# Patient Record
Sex: Female | Born: 1990 | Race: Black or African American | Hispanic: No | Marital: Single | Smoking: Never smoker
Health system: Southern US, Community
[De-identification: ages and names within clinical notes are randomized; demographics above are authoritative.]

## PROBLEM LIST (undated history)

## (undated) DIAGNOSIS — G709 Myoneural disorder, unspecified: Secondary | ICD-10-CM

## (undated) DIAGNOSIS — F32A Depression, unspecified: Secondary | ICD-10-CM

## (undated) DIAGNOSIS — F419 Anxiety disorder, unspecified: Secondary | ICD-10-CM

## (undated) DIAGNOSIS — E282 Polycystic ovarian syndrome: Secondary | ICD-10-CM

## (undated) DIAGNOSIS — T7840XA Allergy, unspecified, initial encounter: Secondary | ICD-10-CM

## (undated) DIAGNOSIS — R519 Headache, unspecified: Secondary | ICD-10-CM

## (undated) HISTORY — DX: Polycystic ovarian syndrome: E28.2

## (undated) HISTORY — DX: Headache, unspecified: R51.9

## (undated) HISTORY — DX: Myoneural disorder, unspecified: G70.9

## (undated) HISTORY — DX: Allergy, unspecified, initial encounter: T78.40XA

## (undated) HISTORY — DX: Anxiety disorder, unspecified: F41.9

## (undated) HISTORY — DX: Depression, unspecified: F32.A

---

## 2020-05-16 ENCOUNTER — Encounter: Payer: Self-pay | Admitting: Obstetrics and Gynecology

## 2020-05-16 ENCOUNTER — Ambulatory Visit (INDEPENDENT_AMBULATORY_CARE_PROVIDER_SITE_OTHER): Payer: BC Managed Care – PPO | Admitting: Obstetrics and Gynecology

## 2020-05-16 ENCOUNTER — Other Ambulatory Visit (HOSPITAL_COMMUNITY)
Admission: RE | Admit: 2020-05-16 | Discharge: 2020-05-16 | Disposition: A | Discharge status: Home / Self Care | Payer: BC Managed Care – PPO | Source: Ambulatory Visit | Attending: Obstetrics and Gynecology | Admitting: Obstetrics and Gynecology

## 2020-05-16 ENCOUNTER — Other Ambulatory Visit: Payer: Self-pay

## 2020-05-16 VITALS — BP 115/79 | HR 89 | Ht 64.0 in | Wt 352.0 lb

## 2020-05-16 DIAGNOSIS — Z01419 Encounter for gynecological examination (general) (routine) without abnormal findings: Secondary | ICD-10-CM

## 2020-05-16 DIAGNOSIS — G43109 Migraine with aura, not intractable, without status migrainosus: Secondary | ICD-10-CM | POA: Insufficient documentation

## 2020-05-16 DIAGNOSIS — Z6841 Body Mass Index (BMI) 40.0 and over, adult: Secondary | ICD-10-CM | POA: Insufficient documentation

## 2020-05-16 DIAGNOSIS — N921 Excessive and frequent menstruation with irregular cycle: Secondary | ICD-10-CM | POA: Insufficient documentation

## 2020-05-16 DIAGNOSIS — N946 Dysmenorrhea, unspecified: Secondary | ICD-10-CM

## 2020-05-16 NOTE — Progress Notes (Signed)
Obstetrics and Gynecology New Patient Evaluation  Appointment Date: 05/16/2020  OBGYN Clinic: [facility]  Primary Care Provider: None  Referring Provider: Self  Chief Complaint:  Chief Complaint  Patient presents with  . Gynecologic Exam  Establish GYN care, annual exam  History of Present Illness: Michelle Velasquez is a 29 y.o. African-American G0P0000 (Patient's last menstrual period was 04/23/2020.), seen for the above chief complaint. Her past medical history is significant for BMI 60s, PCOS, h/o migraines with aura   Patient got insurance and is establishing care. Issues include heavy and irregular periods (see below). No desire for pregnancy any time soon.   Review of Systems: A comprehensive review of systems was negative.    Patient Active Problem List   Diagnosis Date Noted  . Migraine with aura and without status migrainosus, not intractable 05/16/2020  . BMI 60.0-69.9, adult (HCC) 05/16/2020    Past Medical History:  Past Medical History:  Diagnosis Date  . PCOS (polycystic ovarian syndrome)     Past Surgical History:  History reviewed. No pertinent surgical history.  Past Obstetrical History:  OB History  Gravida Para Term Preterm AB Living  0 0 0 0 0 0  SAB TAB Ectopic Multiple Live Births  0 0 0 0 0    Past Gynecological History: As per HPI. Menarche age 60 Periods: early July, 10 days, heavy and painful. She can go 1-3 months w/o a period and they are always heavy and painful and last for 10 days. No h/o any medications, interventions to help with periods History of Pap Smear(s): Yes.   Last pap unknown. No h/o abnormal paps History of STI(s): No. She is currently using no method for contraception.   Social History:  Social History   Socioeconomic History  . Marital status: Single    Spouse name: Not on file  . Number of children: Not on file  . Years of education: Not on file  . Highest education level:  Not on file  Occupational History  . Not on file  Tobacco Use  . Smoking status: Never Smoker  . Smokeless tobacco: Never Used  Substance and Sexual Activity  . Alcohol use: Never  . Drug use: Never  . Sexual activity: Yes  Other Topics Concern  . Not on file  Social History Narrative  . Not on file   Social Determinants of Health   Financial Resource Strain:   . Difficulty of Paying Living Expenses:   Food Insecurity:   . Worried About Programme researcher, broadcasting/film/video in the Last Year:   . Barista in the Last Year:   Transportation Needs:   . Freight forwarder (Medical):   Marland Kitchen Lack of Transportation (Non-Medical):   Physical Activity:   . Days of Exercise per Week:   . Minutes of Exercise per Session:   Stress:   . Feeling of Stress :   Social Connections:   . Frequency of Communication with Friends and Family:   . Frequency of Social Gatherings with Friends and Family:   . Attends Religious Services:   . Active Member of Clubs or Organizations:   . Attends Banker Meetings:   Marland Kitchen Marital Status:   Intimate Partner Violence:   . Fear of Current or Ex-Partner:   . Emotionally Abused:   Marland Kitchen Physically Abused:   . Sexually Abused:     Family History:  Family History  Problem Relation Age of Onset  . Diabetes  Father   . Multiple sclerosis Mother       Medications None  Allergies Sulfa antibiotics and Grass pollen(k-o-r-t-swt vern)   Physical Exam:  BP 115/79   Pulse 89   Ht 5\' 4"  (1.626 m)   Wt (!) 352 lb (159.7 kg)   LMP 04/23/2020   BMI 60.42 kg/m  Body mass index is 60.42 kg/m. General appearance: Well nourished, well developed female in no acute distress.  Neck:  Supple, normal appearance, and no thyromegaly  Cardiovascular: normal s1 and s2.  No murmurs, rubs or gallops. Respiratory:  Clear to auscultation bilateral. Normal respiratory effort Abdomen: positive bowel sounds and no masses, hernias; diffusely non tender to palpation, non  distended Breasts: breasts appear normal, no suspicious masses, no skin or nipple changes or axillary nodes, and normal palpation. Neuro/Psych:  Normal mood and affect.  Skin:  Warm and dry.  Lymphatic:  No inguinal lymphadenopathy.   Pelvic exam: is limited by body habitus EGBUS: within normal limits Vagina: within normal limits and with no blood or discharge in the vault Cervix: normal appearing cervix without tenderness, discharge or lesions. Uterus:  nonenlarged and non tender Adnexa:  normal adnexa and no mass, fullness, tenderness Rectovaginal: deferred  Regular sized Graves speculum able to easily visualize cervix.   Laboratory: None  Radiology: None  Assessment: pt stable  Plan:  1. Primary care Patient fasting. Will refer to primary care and get basic labs today, with primary concern to establish care and help with weight loss.   2. Heavy, irregular periods D/w her re: weight loss will help with period regularity. Given her h/o migraines with aura, I told her I don't recommend any estrogen options. Options d/w her were POPs, cyclic provera, depo provera, mirena IUD and I told her I'd recommend mirena IUD given less chance for systemic side effects. Brochure given and will have her follow up in a few weeks  3. GYN care  RTC 2-3 weeks for follow up period discussion  06/24/2020 MD Attending [facility])

## 2020-05-17 ENCOUNTER — Encounter: Payer: Self-pay | Admitting: Obstetrics and Gynecology

## 2020-05-17 DIAGNOSIS — R7303 Prediabetes: Secondary | ICD-10-CM | POA: Insufficient documentation

## 2020-05-17 LAB — CYTOLOGY - PAP
Chlamydia: NEGATIVE
Comment: NEGATIVE
Comment: NEGATIVE
Comment: NEGATIVE
Comment: NORMAL
Diagnosis: NEGATIVE
High risk HPV: NEGATIVE
Neisseria Gonorrhea: NEGATIVE
Trichomonas: NEGATIVE

## 2020-05-17 LAB — URINALYSIS, ROUTINE W REFLEX MICROSCOPIC
Bilirubin, UA: NEGATIVE
Glucose, UA: NEGATIVE
Ketones, UA: NEGATIVE
Leukocytes,UA: NEGATIVE
Nitrite, UA: NEGATIVE
Protein,UA: NEGATIVE
RBC, UA: NEGATIVE
Specific Gravity, UA: 1.023 (ref 1.005–1.030)
Urobilinogen, Ur: 0.2 mg/dL (ref 0.2–1.0)
pH, UA: 6.5 (ref 5.0–7.5)

## 2020-05-17 LAB — COMPREHENSIVE METABOLIC PANEL
ALT: 21 IU/L (ref 0–32)
AST: 19 IU/L (ref 0–40)
Albumin/Globulin Ratio: 1.2 (ref 1.2–2.2)
Albumin: 3.9 g/dL (ref 3.8–4.8)
Alkaline Phosphatase: 93 IU/L (ref 48–121)
BUN/Creatinine Ratio: 11 (ref 9–23)
BUN: 9 mg/dL (ref 6–20)
Bilirubin Total: 0.4 mg/dL (ref 0.0–1.2)
CO2: 20 mmol/L (ref 20–29)
Calcium: 9.3 mg/dL (ref 8.7–10.2)
Chloride: 103 mmol/L (ref 96–106)
Creatinine, Ser: 0.82 mg/dL (ref 0.57–1.00)
GFR calc Af Amer: 110 mL/min/{1.73_m2} (ref >59)
GFR calc non Af Amer: 96 mL/min/{1.73_m2} (ref >59)
Globulin, Total: 3.2 g/dL (ref 1.5–4.5)
Glucose: 105 mg/dL — ABNORMAL HIGH (ref 65–99)
Potassium: 4.4 mmol/L (ref 3.5–5.2)
Sodium: 138 mmol/L (ref 134–144)
Total Protein: 7.1 g/dL (ref 6.0–8.5)

## 2020-05-17 LAB — CBC
Hematocrit: 35 % (ref 34.0–46.6)
Hemoglobin: 10.9 g/dL — ABNORMAL LOW (ref 11.1–15.9)
MCH: 22.7 pg — ABNORMAL LOW (ref 26.6–33.0)
MCHC: 31.1 g/dL — ABNORMAL LOW (ref 31.5–35.7)
MCV: 73 fL — ABNORMAL LOW (ref 79–97)
Platelets: 303 10*3/uL (ref 150–450)
RBC: 4.8 x10E6/uL (ref 3.77–5.28)
RDW: 19.1 % — ABNORMAL HIGH (ref 11.7–15.4)
WBC: 6.6 10*3/uL (ref 3.4–10.8)

## 2020-05-17 LAB — HEPATITIS C ANTIBODY: Hep C Virus Ab: <0.1 s/co ratio (ref 0.0–0.9)

## 2020-05-17 LAB — HEPATITIS B SURFACE ANTIGEN: Hepatitis B Surface Ag: NEGATIVE

## 2020-05-17 LAB — HEMOGLOBIN A1C
Est. average glucose Bld gHb Est-mCnc: 117 mg/dL
Hgb A1c MFr Bld: 5.7 % — ABNORMAL HIGH (ref 4.8–5.6)

## 2020-05-17 LAB — LIPID PANEL
Chol/HDL Ratio: 3.9 ratio (ref 0.0–4.4)
Cholesterol, Total: 152 mg/dL (ref 100–199)
HDL: 39 mg/dL — ABNORMAL LOW (ref >39)
LDL Chol Calc (NIH): 96 mg/dL (ref 0–99)
Triglycerides: 89 mg/dL (ref 0–149)
VLDL Cholesterol Cal: 17 mg/dL (ref 5–40)

## 2020-05-17 LAB — RPR: RPR Ser Ql: NONREACTIVE

## 2020-05-17 LAB — TSH: TSH: 3.1 u[IU]/mL (ref 0.450–4.500)

## 2020-05-17 LAB — HIV ANTIBODY (ROUTINE TESTING W REFLEX): HIV Screen 4th Generation wRfx: NONREACTIVE

## 2020-05-17 LAB — PROLACTIN: Prolactin: 12.4 ng/mL (ref 4.8–23.3)

## 2020-05-30 ENCOUNTER — Ambulatory Visit: Payer: BC Managed Care – PPO | Admitting: Family Medicine

## 2020-06-25 ENCOUNTER — Ambulatory Visit: Payer: BC Managed Care – PPO | Admitting: Medical

## 2020-06-27 ENCOUNTER — Ambulatory Visit: Payer: BC Managed Care – PPO | Admitting: Family Medicine

## 2020-08-07 DIAGNOSIS — A059 Bacterial foodborne intoxication, unspecified: Secondary | ICD-10-CM | POA: Diagnosis not present

## 2020-08-07 DIAGNOSIS — R112 Nausea with vomiting, unspecified: Secondary | ICD-10-CM | POA: Diagnosis not present

## 2020-08-19 ENCOUNTER — Ambulatory Visit: Payer: BC Managed Care – PPO | Admitting: Medical

## 2020-08-19 DIAGNOSIS — Z0289 Encounter for other administrative examinations: Secondary | ICD-10-CM

## 2020-10-04 ENCOUNTER — Emergency Department (HOSPITAL_COMMUNITY)
Admission: EM | Admit: 2020-10-04 | Discharge: 2020-10-05 | Disposition: A | Discharge status: Home / Self Care | Payer: BC Managed Care – PPO | Attending: Emergency Medicine | Admitting: Emergency Medicine

## 2020-10-04 ENCOUNTER — Encounter (HOSPITAL_COMMUNITY): Payer: Self-pay | Admitting: Emergency Medicine

## 2020-10-04 ENCOUNTER — Other Ambulatory Visit: Payer: Self-pay

## 2020-10-04 DIAGNOSIS — G43909 Migraine, unspecified, not intractable, without status migrainosus: Secondary | ICD-10-CM | POA: Diagnosis not present

## 2020-10-04 DIAGNOSIS — H53132 Sudden visual loss, left eye: Secondary | ICD-10-CM | POA: Diagnosis not present

## 2020-10-04 DIAGNOSIS — R7303 Prediabetes: Secondary | ICD-10-CM | POA: Diagnosis not present

## 2020-10-04 NOTE — ED Triage Notes (Signed)
Pt presents to ED POV. Pt c/o migraines and loss o f vision in L eye. Pt states that she has hx of migraine. PERRLA, AAO x4

## 2020-10-05 ENCOUNTER — Emergency Department (HOSPITAL_BASED_OUTPATIENT_CLINIC_OR_DEPARTMENT_OTHER): Payer: BC Managed Care – PPO

## 2020-10-05 ENCOUNTER — Encounter (HOSPITAL_BASED_OUTPATIENT_CLINIC_OR_DEPARTMENT_OTHER): Payer: Self-pay | Admitting: Emergency Medicine

## 2020-10-05 ENCOUNTER — Emergency Department (HOSPITAL_BASED_OUTPATIENT_CLINIC_OR_DEPARTMENT_OTHER)
Admission: EM | Admit: 2020-10-05 | Discharge: 2020-10-05 | Disposition: A | Discharge status: Home / Self Care | Payer: BC Managed Care – PPO | Source: Home / Self Care | Attending: Emergency Medicine | Admitting: Emergency Medicine

## 2020-10-05 DIAGNOSIS — G43109 Migraine with aura, not intractable, without status migrainosus: Secondary | ICD-10-CM

## 2020-10-05 DIAGNOSIS — G4459 Other complicated headache syndrome: Secondary | ICD-10-CM | POA: Insufficient documentation

## 2020-10-05 DIAGNOSIS — R7303 Prediabetes: Secondary | ICD-10-CM | POA: Insufficient documentation

## 2020-10-05 LAB — HCG, QUANTITATIVE, PREGNANCY: hCG, Beta Chain, Quant, S: <1 m[IU]/mL (ref <5)

## 2020-10-05 IMAGING — CT CT HEAD W/O CM
3 series · 15 of 45 positions shown, 18 images · non-contrast
Comparison: None.

CLINICAL DATA: Migraine headaches with left-sided visual loss. No
reported injury.

EXAM:
CT HEAD WITHOUT CONTRAST
TECHNIQUE: Contiguous axial images were obtained from the base of the skull
through the vertex without intravenous contrast.

[Series 2: head wo · axial · 0.39mm/px · z∈[+729,+844]mm · 9 of 28 slices shown, 12 images]
[im 3/28  brain]
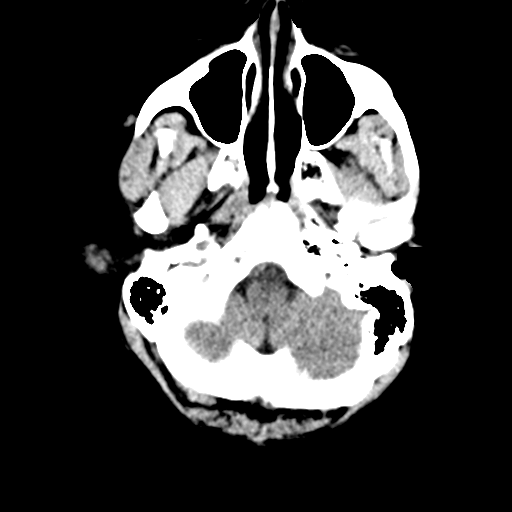
[im 3/28  bone]
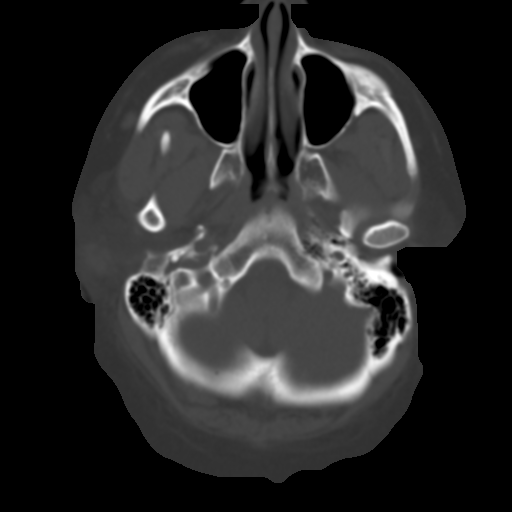
[im 6/28  brain]
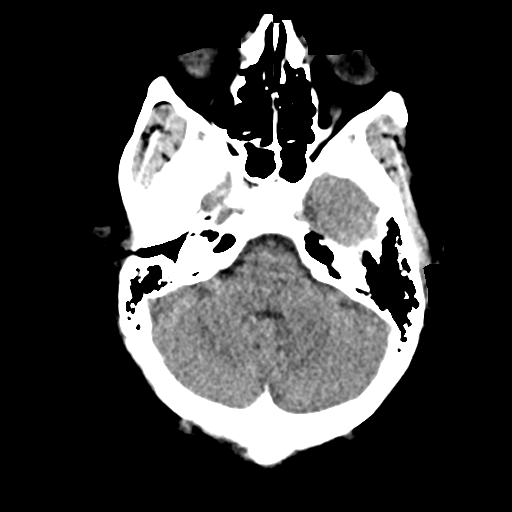
[im 9/28  brain]
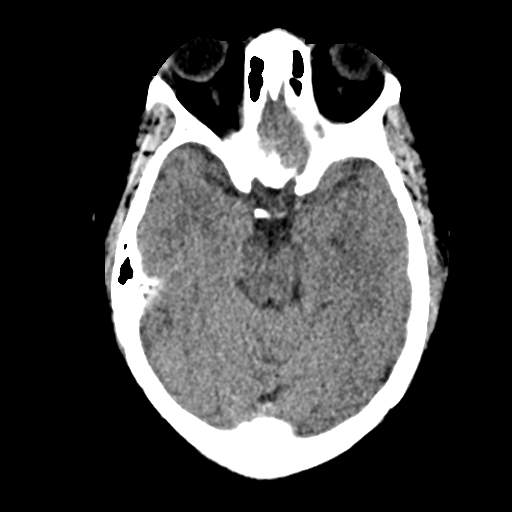
[im 12/28  brain]
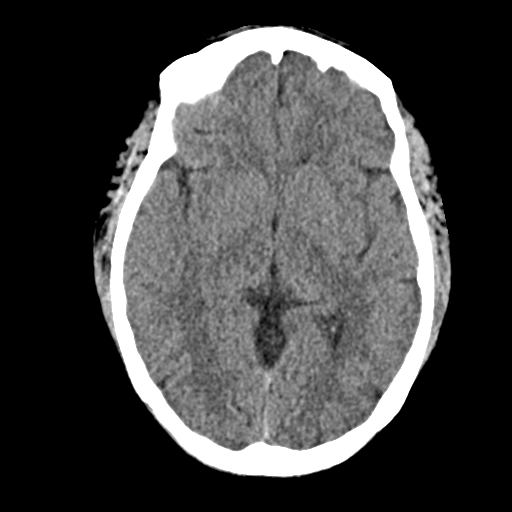
[im 15/28  brain]
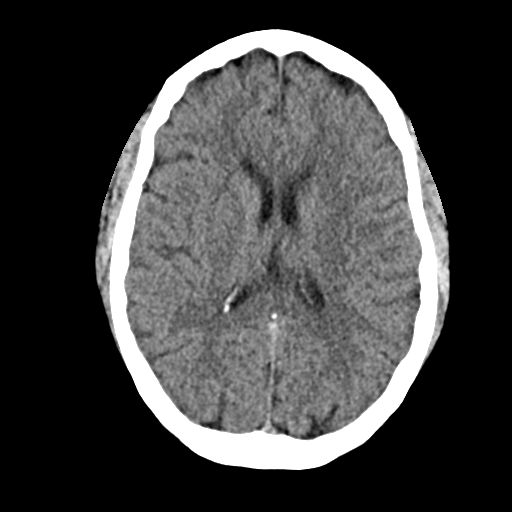
[im 15/28  bone]
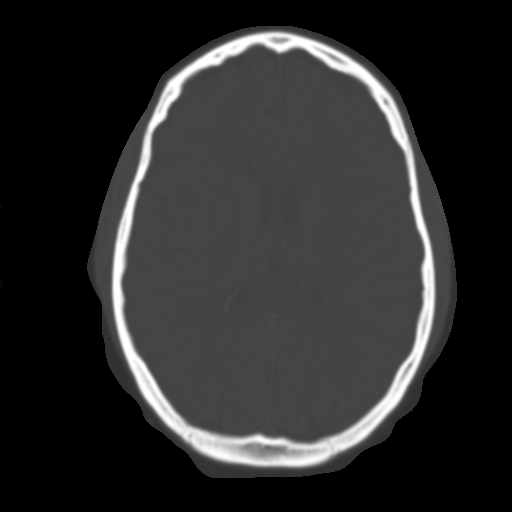
[im 17/28  brain]
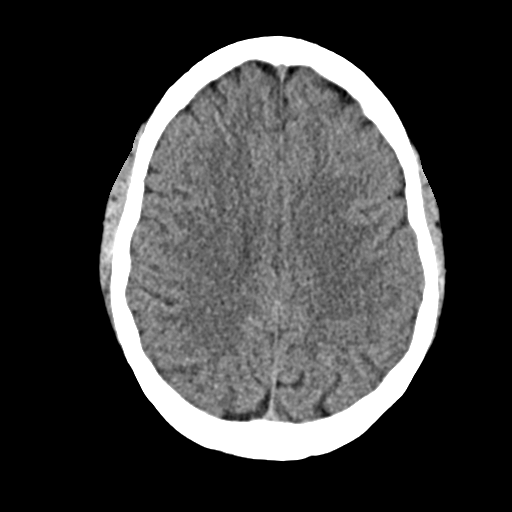
[im 20/28  brain]
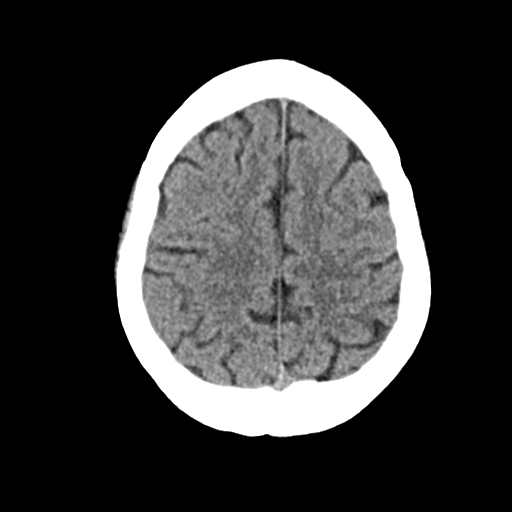
[im 23/28  brain]
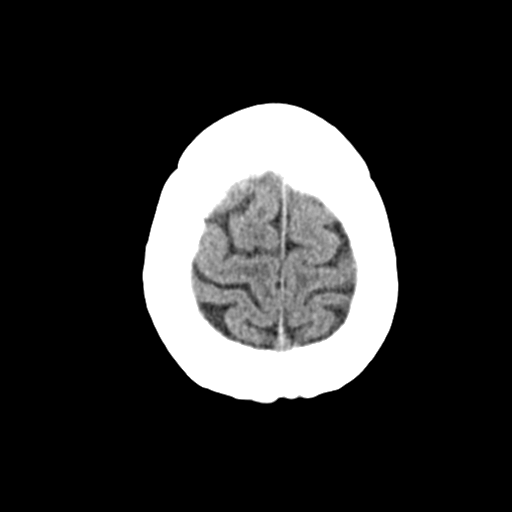
[im 26/28  brain]
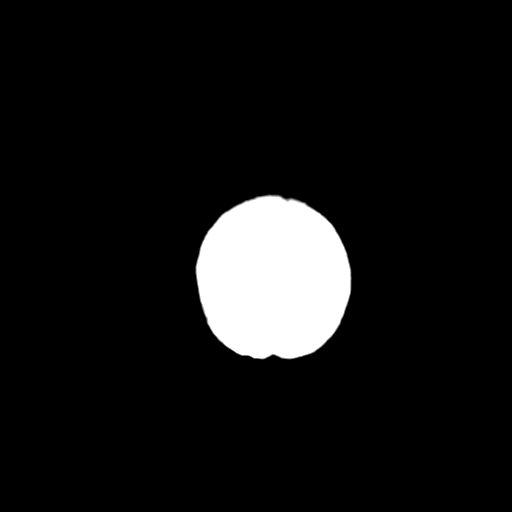
[im 26/28  bone]
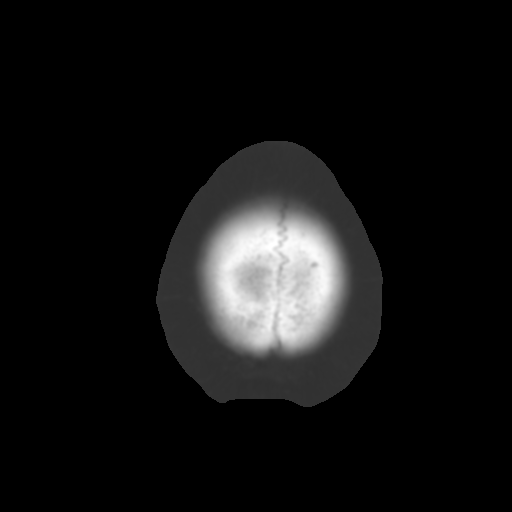

[Series 4: coronal soft · coronal · 0.29mm/px · 3 of 64 slices shown]
[im 22/64  brain]
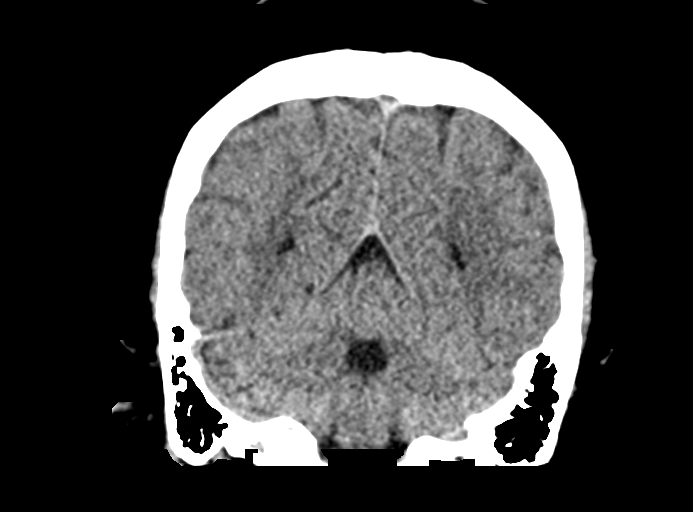
[im 29/64  brain]
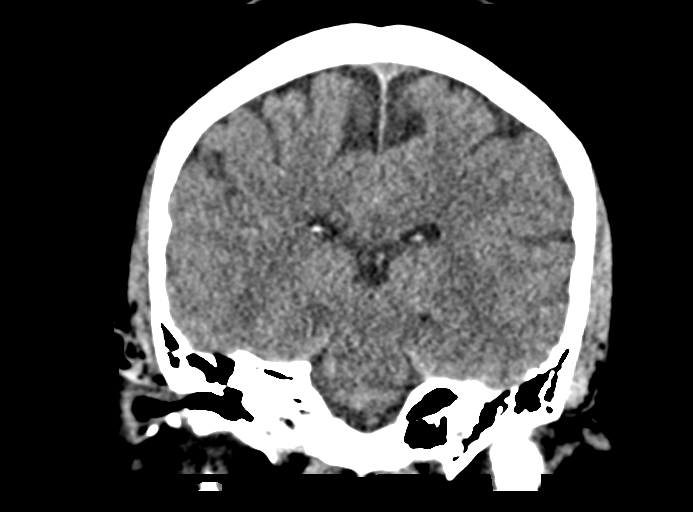
[im 36/64  brain]
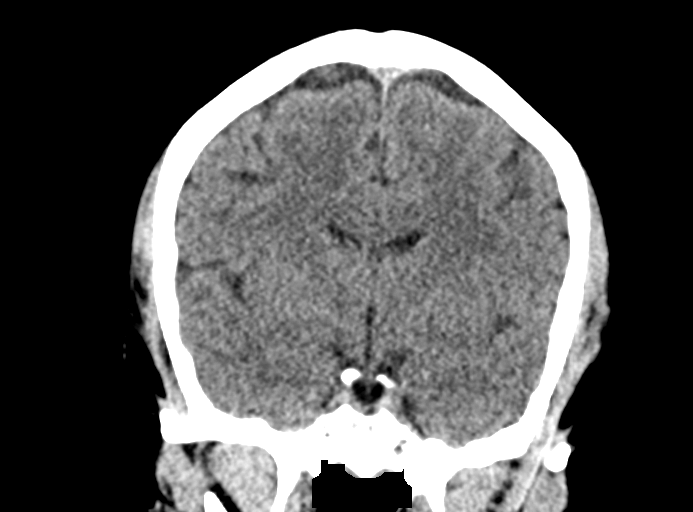

[Series 5: sag soft · sagittal · 0.28mm/px · 3 of 53 slices shown]
[im 18/53  brain]
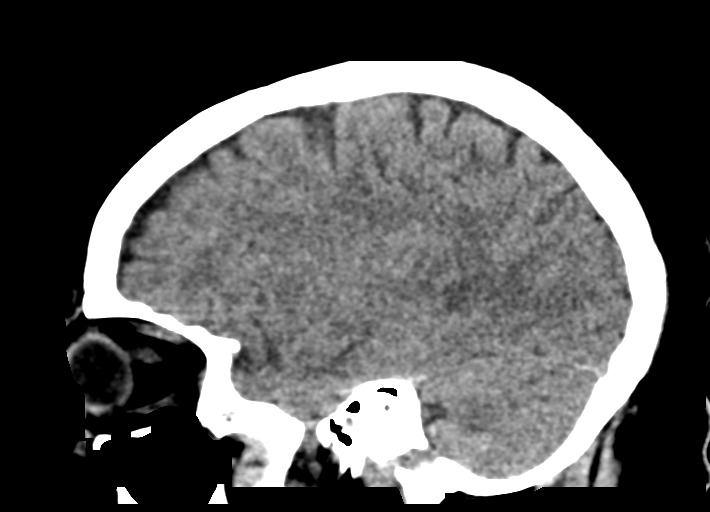
[im 27/53  brain]
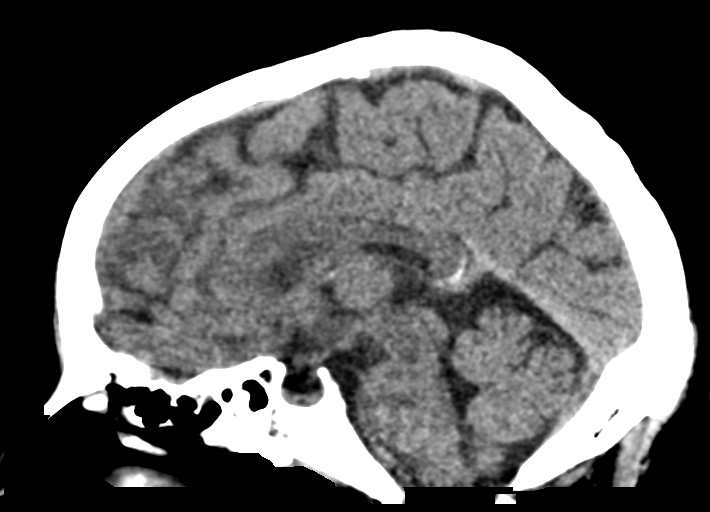
[im 35/53  brain]
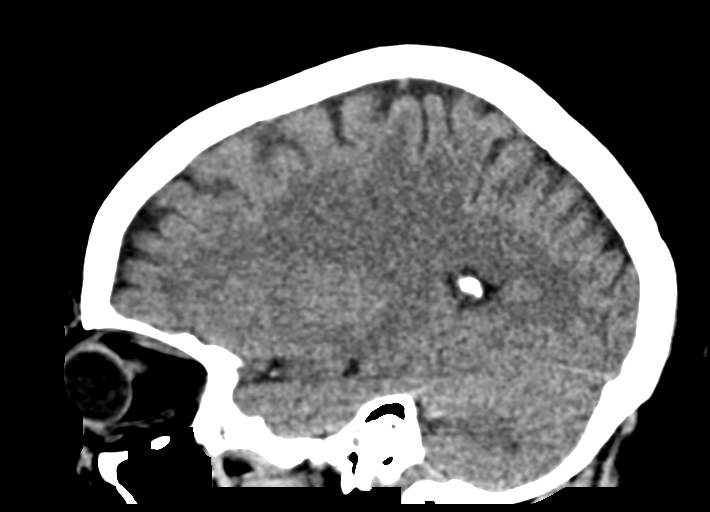

[15 of 45 positions shown; findings below may reference images not displayed]

FINDINGS: Brain: No evidence of parenchymal hemorrhage or extra-axial fluid
collection. No mass lesion, mass effect, or midline shift. No CT
evidence of acute infarction. Cerebral volume is age appropriate. No
ventriculomegaly.

Vascular: No acute abnormality.

Skull: No evidence of calvarial fracture.

Sinuses/Orbits: The visualized paranasal sinuses are essentially
clear.

Other:  The mastoid air cells are unopacified.
IMPRESSION: Negative head CT. No evidence of acute intracranial abnormality.

## 2020-10-05 MED ORDER — DIPHENHYDRAMINE HCL 50 MG/ML IJ SOLN
25.0000 mg | Freq: Once | INTRAMUSCULAR | Status: AC
  Administered 2020-10-05: 25 mg via INTRAVENOUS
  Filled 2020-10-05: qty 1

## 2020-10-05 MED ORDER — PROCHLORPERAZINE EDISYLATE 10 MG/2ML IJ SOLN
10.0000 mg | Freq: Once | INTRAMUSCULAR | Status: AC
  Administered 2020-10-05: 10 mg via INTRAVENOUS
  Filled 2020-10-05: qty 2

## 2020-10-05 MED ORDER — DEXAMETHASONE SODIUM PHOSPHATE 10 MG/ML IJ SOLN
10.0000 mg | Freq: Once | INTRAMUSCULAR | Status: AC
  Administered 2020-10-05: 10 mg via INTRAVENOUS
  Filled 2020-10-05: qty 1

## 2020-10-05 NOTE — ED Triage Notes (Addendum)
Pt reports that she had a migraine yesterday and complete loss of vision to L eye that lasted 20 min. She went to [facility] but LWBS. States the vision has returned but still endorses headache.

## 2020-10-05 NOTE — ED Notes (Addendum)
Patient ambulated to CT

## 2020-10-05 NOTE — ED Notes (Signed)
Pt called for vitals no answer. °

## 2020-10-05 NOTE — ED Notes (Addendum)
Unsuccessful IV attempt x 2, RAC, LAC. Pt tolerated well. Sophie, RN will attempt IV

## 2020-10-05 NOTE — ED Notes (Signed)
ED Provider at bedside. 

## 2020-10-05 NOTE — ED Notes (Signed)
Pt ambulatory with steady gait to restroom 

## 2020-10-07 NOTE — ED Provider Notes (Signed)
[facility] EMERGENCY DEPARTMENT Provider Note   CSN: [id number] Arrival date & time: 10/05/20  0841     History Chief Complaint  Patient presents with  . Eye Problem  . Headache    Michelle Velasquez is a 29 y.o. female.  29 year old female with history of migraines who presents to the emergency department today secondary to increasing number of migraines.  Patient states that she has had a call 10 times for work in the last month alone.  Patient states that her last 24 hours she noticed that she did have some decreased vision in her left eye.  Initially she stated that it was black but then it came back and was just blurry and now it still blurry but better.  She states her mom has a history of multiple sclerosis but she does not have a personal history of it.  She is also overweight but her headache seems to be right behind the eye and not necessarily diffuse.  She has no problems with the right eye.  She has no neurologic complaints otherwise.  Does not take any thing for headaches at home besides over-the-counter medications.  Has never seen a neurologist.   Eye Problem Associated symptoms: headaches   Headache      Past Medical History:  Diagnosis Date  . PCOS (polycystic ovarian syndrome)     Patient Active Problem List   Diagnosis Date Noted  . Prediabetes 05/17/2020  . Migraine with aura and without status migrainosus, not intractable 05/16/2020  . BMI 60.0-69.9, adult (HCC) 05/16/2020  . Dysmenorrhea 05/16/2020  . Menorrhagia with irregular cycle 05/16/2020    History reviewed. No pertinent surgical history.   OB History    Gravida  0   Para  0   Term  0   Preterm  0   AB  0   Living  0     SAB  0   IAB  0   Ectopic  0   Multiple  0   Live Births  0           Family History  Problem Relation Age of Onset  . Diabetes Father   . Multiple sclerosis Mother     Social History   Tobacco Use  . Smoking status: Never Smoker   . Smokeless tobacco: Never Used  Substance Use Topics  . Alcohol use: Never  . Drug use: Never    Home Medications Prior to Admission medications   Not on File    Allergies    Sulfa antibiotics and Grass pollen(k-o-r-t-swt vern)  Review of Systems   Review of Systems  Neurological: Positive for headaches.  All other systems reviewed and are negative.   Physical Exam Updated Vital Signs BP (!) 135/92 (BP Location: Right Wrist)   Pulse 90   Temp 99 F (37.2 C) (Oral)   Resp 18   Ht 5\' 4"  (1.626 m)   Wt (!) 158.8 kg   SpO2 99%   BMI 60.08 kg/m   Physical Exam Vitals and nursing note reviewed.  Constitutional:      Appearance: She is well-developed and well-nourished.  HENT:     Head: Normocephalic and atraumatic.     Nose: Nose normal. No congestion or rhinorrhea.     Mouth/Throat:     Mouth: Mucous membranes are moist.     Pharynx: Oropharynx is clear.  Eyes:     General: No visual field deficit.    Pupils: Pupils are equal, round,  and reactive to light.  Cardiovascular:     Rate and Rhythm: Normal rate and regular rhythm.  Pulmonary:     Effort: No respiratory distress.     Breath sounds: No stridor.  Abdominal:     General: Abdomen is flat. There is no distension.  Musculoskeletal:        General: No swelling or tenderness. Normal range of motion.     Cervical back: Normal range of motion.  Skin:    General: Skin is warm and dry.  Neurological:     Mental Status: She is alert and oriented to person, place, and time.     GCS: GCS eye subscore is 4. GCS verbal subscore is 5. GCS motor subscore is 6.     Cranial Nerves: No cranial nerve deficit, dysarthria or facial asymmetry.     Sensory: No sensory deficit.     Motor: No weakness.     Coordination: Romberg sign negative. Coordination normal.     Gait: Gait normal.     Deep Tendon Reflexes: Reflexes normal.     ED Results / Procedures / Treatments   Labs (all labs ordered are listed, but only  abnormal results are displayed) Labs Reviewed  HCG, QUANTITATIVE, PREGNANCY    EKG None  Radiology CT Head Wo Contrast  Result Date: 10/05/2020 CLINICAL DATA:  Migraine headaches with left-sided visual loss. No reported injury. EXAM: CT HEAD WITHOUT CONTRAST TECHNIQUE: Contiguous axial images were obtained from the base of the skull through the vertex without intravenous contrast. COMPARISON:  None. FINDINGS: Brain: No evidence of parenchymal hemorrhage or extra-axial fluid collection. No mass lesion, mass effect, or midline shift. No CT evidence of acute infarction. Cerebral volume is age appropriate. No ventriculomegaly. Vascular: No acute abnormality. Skull: No evidence of calvarial fracture. Sinuses/Orbits: The visualized paranasal sinuses are essentially clear. Other:  The mastoid air cells are unopacified. IMPRESSION: Negative head CT. No evidence of acute intracranial abnormality. Electronically Signed   By: Delbert Phenix M.D.   On: 10/05/2020 10:29    Procedures Procedures (including critical care time)  Medications Ordered in ED Medications  prochlorperazine (COMPAZINE) injection 10 mg (10 mg Intravenous Given 10/05/20 1046)  diphenhydrAMINE (BENADRYL) injection 25 mg (25 mg Intravenous Given 10/05/20 1041)  dexamethasone (DECADRON) injection 10 mg (10 mg Intravenous Given 10/05/20 1042)    ED Course  I have reviewed the triage vital signs and the nursing notes.  Pertinent labs & imaging results that were available during my care of the patient were reviewed by me and considered in my medical decision making (see chart for details).    MDM Rules/Calculators/A&P                          Considered idiopathic intracranial hypertension however does not really fit that as the headache is one-sided and started with black vision and improved slowly rather than the other way around.  Also consider multiple sclerosis however is not a typical story for that. Doubt stroke or head  bleed with presentation and normal ct. Symptoms improved significantly with headache cocktail so it could very well be a complicatedmigraine. Either way, needs neuro follow up. Referral placed. Return precautions discussed.   Final Clinical Impression(s) / ED Diagnoses Final diagnoses:  Complicated migraine    Rx / DC Orders ED Discharge Orders         Ordered    Ambulatory referral to Neurology       Comments:  An appointment is requested in approximately: 2 weeks   10/05/20 1331           Jamaya Sleeth, Barbara Cower, MD 10/07/20 [phone]
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
## 2020-10-24 ENCOUNTER — Ambulatory Visit (INDEPENDENT_AMBULATORY_CARE_PROVIDER_SITE_OTHER): Payer: Self-pay | Admitting: Neurology

## 2020-10-24 ENCOUNTER — Other Ambulatory Visit: Payer: Self-pay

## 2020-10-24 ENCOUNTER — Encounter: Payer: Self-pay | Admitting: Neurology

## 2020-10-24 VITALS — BP 136/87 | HR 112 | Ht 64.0 in | Wt 356.0 lb

## 2020-10-24 DIAGNOSIS — R410 Disorientation, unspecified: Secondary | ICD-10-CM

## 2020-10-24 DIAGNOSIS — G43109 Migraine with aura, not intractable, without status migrainosus: Secondary | ICD-10-CM

## 2020-10-24 DIAGNOSIS — G4719 Other hypersomnia: Secondary | ICD-10-CM

## 2020-10-24 DIAGNOSIS — H5462 Unqualified visual loss, left eye, normal vision right eye: Secondary | ICD-10-CM

## 2020-10-24 MED ORDER — RIZATRIPTAN BENZOATE 10 MG PO TBDP: ORAL_TABLET | ORAL | 5 refills | Status: DC

## 2020-10-24 MED ORDER — TOPIRAMATE 50 MG PO TABS: 50.0000 mg | ORAL_TABLET | Freq: Every day | ORAL | 5 refills | Status: DC

## 2020-10-24 MED ORDER — ONDANSETRON HCL 4 MG PO TABS: 4.0000 mg | ORAL_TABLET | Freq: Three times a day (TID) | ORAL | 5 refills | Status: DC | PRN

## 2020-10-24 NOTE — Patient Instructions (Signed)
  1. MRI of brain with and without contrast 2. Routine EEG 3. Sleep study 4. Refer to ophthalmology 5. Start topiramate 50mg  at bedtime.  Contact in 4 weeks with update and we can increase dose if needed. 6. Take rizatriptan 10mg  at earliest onset of headache.  May repeat dose once in 2 hours if needed.  Maximum 2 tablets in 24 hours.  Continue ondansetron (Zofran) for nausea as needed. 7. Limit use of pain relievers to no more than 2 days out of the week.  These medications include acetaminophen, NSAIDs (ibuprofen/Advil/Motrin, naproxen/Aleve, triptans (Imitrex/sumatriptan), Excedrin, and narcotics.  This will help reduce risk of rebound headaches. 8. Be aware of common food triggers:  - Caffeine:  coffee, black tea, cola, Mt. Dew  - Chocolate  - Dairy:  aged cheeses (brie, blue, cheddar, gouda, [place], provolone, [place], Swiss, etc), chocolate milk, buttermilk, sour cream, limit eggs and yogurt  - Nuts, peanut butter  - Alcohol  - Cereals/grains:  FRESH breads (fresh bagels, sourdough, doughnuts), yeast productions  - Processed/canned/aged/cured meats (pre-packaged deli meats, hotdogs)  - MSG/glutamate:  soy sauce, flavor enhancer, pickled/preserved/marinated foods  - Sweeteners:  aspartame (Equal, Nutrasweet).  Sugar and Splenda are okay  - Vegetables:  legumes (lima beans, lentils, snow peas, fava beans, pinto peans, peas, garbanzo beans), sauerkraut, onions, olives, pickles  - Fruit:  avocados, bananas, citrus fruit (orange, lemon, grapefruit), mango  - Other:  Frozen meals, macaroni and cheese 9. Routine exercise 10. Stay adequately hydrated (aim for 64 oz water daily) 11. Keep headache diary 12. Maintain proper stress management 13. Maintain proper sleep hygiene 14. Do not skip meals 15. Consider supplements:  magnesium citrate 400mg  daily, riboflavin 400mg  daily, coenzyme Q10 100mg  three times daily.

## 2020-11-04 ENCOUNTER — Other Ambulatory Visit: Payer: BC Managed Care – PPO

## 2020-11-06 ENCOUNTER — Ambulatory Visit (INDEPENDENT_AMBULATORY_CARE_PROVIDER_SITE_OTHER): Payer: No Typology Code available for payment source | Admitting: Neurology

## 2020-11-06 ENCOUNTER — Other Ambulatory Visit: Payer: Self-pay

## 2020-11-06 DIAGNOSIS — R410 Disorientation, unspecified: Secondary | ICD-10-CM | POA: Diagnosis not present

## 2020-11-11 NOTE — Procedures (Signed)
ELECTROENCEPHALOGRAM REPORT  Date of Study: 11/06/2020  Patient's Name: Michelle Velasquez MRN: [id number] Date of Birth: [birth year]   Clinical History: 30 year old female with episodic confusion.  History of "night terrors" in which she wakes up scared and confused.   Medications: None  Technical Summary: A multichannel digital EEG recording measured by the international 10-20 system with electrodes applied with paste and impedances below 5000 ohms performed in our laboratory with EKG monitoring in an awake and asleep patient.  Hyperventilation not performed as patient wearing a face mask due to the COVID-19 pandemic.  Photic stimulation was performed.  The digital EEG was referentially recorded, reformatted, and digitally filtered in a variety of bipolar and referential montages for optimal display.    Description: The patient is awake and asleep during the recording.  During maximal wakefulness, there is a symmetric, medium voltage 10 Hz posterior dominant rhythm that attenuates with eye opening.  The record is symmetric.  During drowsiness and sleep, there is an increase in theta slowing of the background.  Vertex waves and symmetric sleep spindles were seen.  Photic stimulation did not elicit any abnormalities.  There were no epileptiform discharges or electrographic seizures seen.    EKG lead was unremarkable.  Impression: This awake and asleep EEG is normal.    Clinical Correlation: A normal EEG does not exclude a clinical diagnosis of epilepsy.  If further clinical questions remain, prolonged EEG may be helpful.  Clinical correlation is advised.   Shon Millet, DO

## 2020-11-12 NOTE — Progress Notes (Signed)
Tried calling pt, no answer. LMOVM to call the office in regards to EEG results.

## 2020-11-13 NOTE — Progress Notes (Signed)
Called patient and left a message to call back office.

## 2020-11-23 ENCOUNTER — Other Ambulatory Visit: Payer: Self-pay

## 2020-11-25 ENCOUNTER — Ambulatory Visit
Admission: RE | Admit: 2020-11-25 | Discharge: 2020-11-25 | Disposition: A | Discharge status: Home / Self Care | Payer: No Typology Code available for payment source | Source: Ambulatory Visit | Attending: Neurology | Admitting: Neurology

## 2020-11-25 ENCOUNTER — Other Ambulatory Visit: Payer: Self-pay

## 2020-11-25 ENCOUNTER — Telehealth: Payer: Self-pay | Admitting: Neurology

## 2020-11-25 DIAGNOSIS — R410 Disorientation, unspecified: Secondary | ICD-10-CM

## 2020-11-25 DIAGNOSIS — G43109 Migraine with aura, not intractable, without status migrainosus: Secondary | ICD-10-CM

## 2020-11-25 DIAGNOSIS — H5462 Unqualified visual loss, left eye, normal vision right eye: Secondary | ICD-10-CM

## 2020-11-25 IMAGING — MR MR HEAD WO/W CM
13 series · 48 of 48 positions shown · IV contrast (multihance)
Comparison: Head CT [DATE]

CLINICAL DATA: Headache, chronic. Family history of multiple
sclerosis.

EXAM:
MRI HEAD WITHOUT AND WITH CONTRAST
TECHNIQUE: Multiplanar, multiecho pulse sequences of the brain and surrounding
structures were obtained without and with intravenous contrast.
CONTRAST:  20mL MULTIHANCE GADOBENATE DIMEGLUMINE 529 MG/ML IV SOLN

[Series 2: t1_se_sag · sagittal · 5.0mm · 0.45mm/px · 1 of 21 slices shown]
[im 1/21]
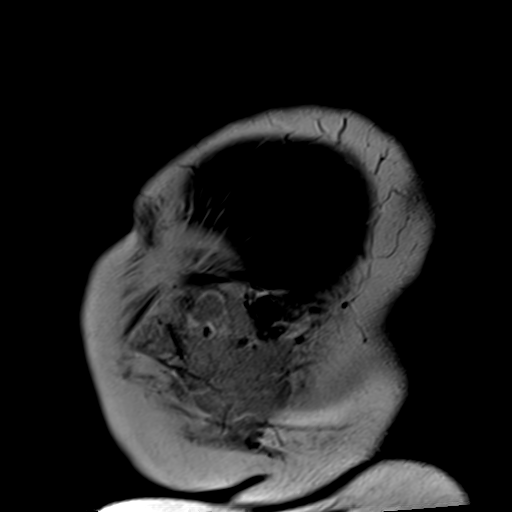

[Series 3: ep2d_diff_3 · axial · 3.0mm · 1.80mm/px · z∈[-46,+94]mm · 5 of 91 slices shown]
[im 1/91]
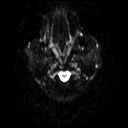
[im 23/91]
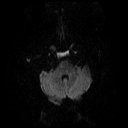
[im 46/91]
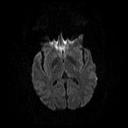
[im 68/91]
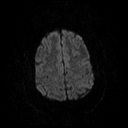
[im 91/91]
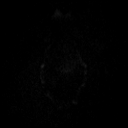

[Series 4: ep2d_diff_3_adc · axial · 3.0mm · 1.80mm/px · z∈[-46,+94]mm · 2 of 48 slices shown]
[im 1/48]
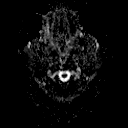
[im 48/48]
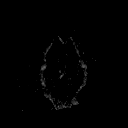

[Series 5: ep2d_diff_cor · coronal · 5.0mm · 1.77mm/px · 3 of 47 slices shown]
[im 1/47]
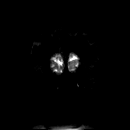
[im 24/47]
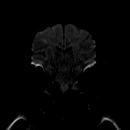
[im 47/47]
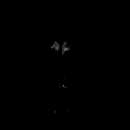

[Series 6: ep2d_diff_cor_adc · coronal · 5.0mm · 1.77mm/px · 2 of 24 slices shown]
[im 1/24]
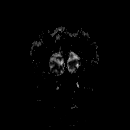
[im 24/24]
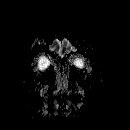

[Series 8: swi_images · axial · 2.0mm · 0.90mm/px · z∈[-55,+103]mm · 5 of 80 slices shown]
[im 1/80]
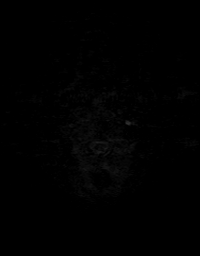
[im 20/80]
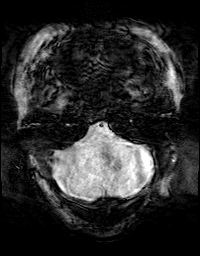
[im 40/80]
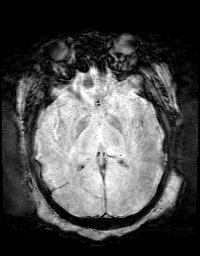
[im 60/80]
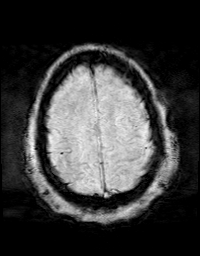
[im 80/80]
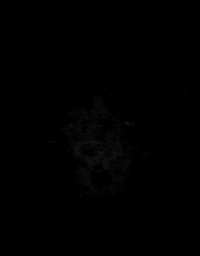

[Series 9: FLAIR · axial · 3.0mm · 0.43mm/px · z∈[-54,+102]mm · 2 of 27 slices shown (1 of 2)]
[im 1/27]
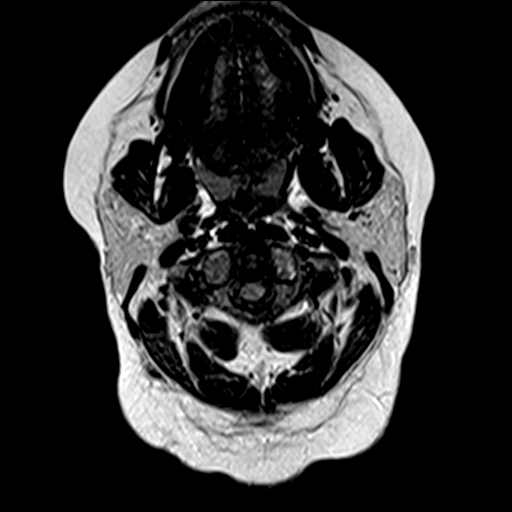
[im 27/27]
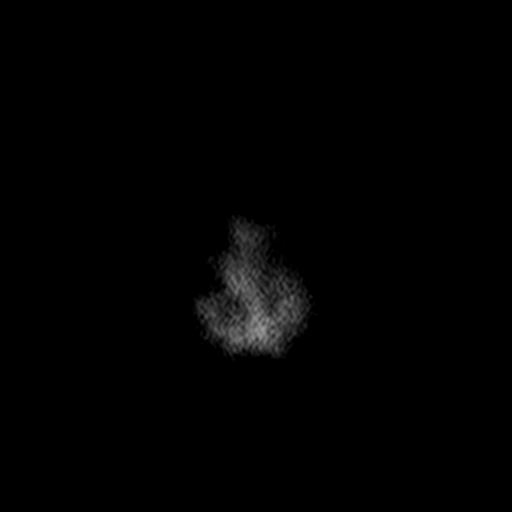

[Series 10: t2_tse_tra_512 · axial · 5.0mm · 0.60mm/px · z∈[-47,+91]mm · 2 of 24 slices shown]
[im 1/24]
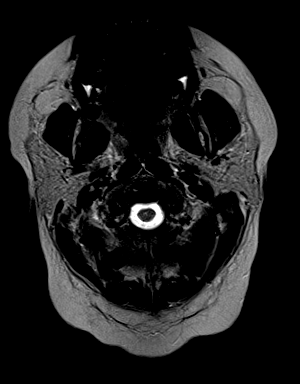
[im 24/24]
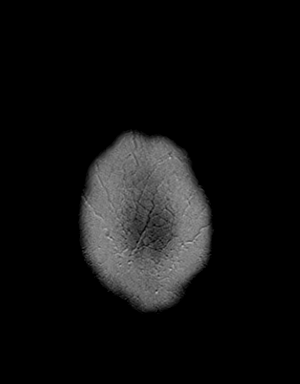

[Series 11: FLAIR · sagittal · 5.0mm · 0.45mm/px · 2 of 25 slices shown (2 of 2)]
[im 1/25]
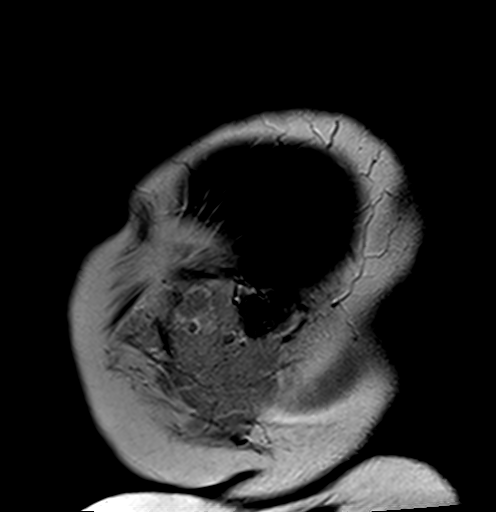
[im 25/25]
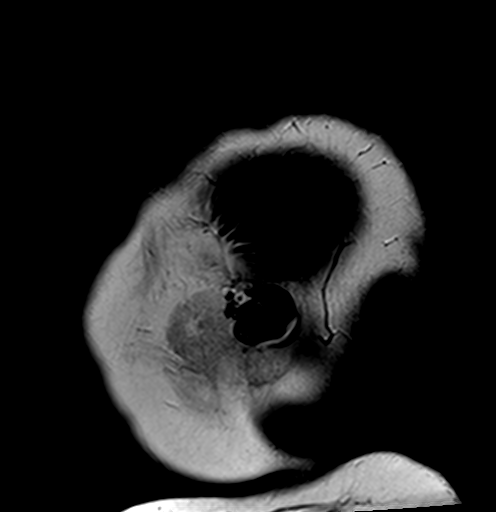

[Series 12: t1_mpr_tra · axial · 1.0mm · 0.72mm/px · z∈[-57,+102]mm · 10 of 160 slices shown]
[im 1/160]
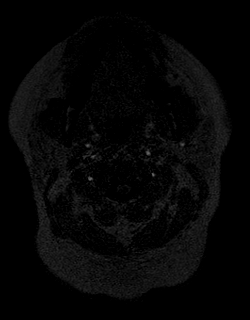
[im 18/160]
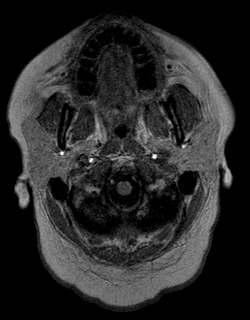
[im 36/160]
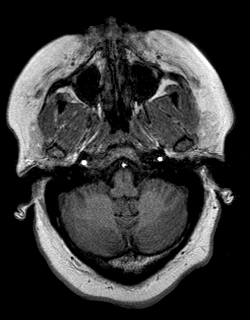
[im 54/160]
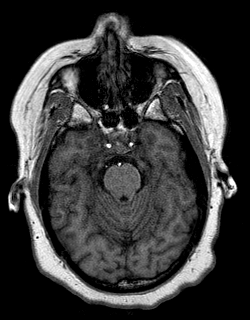
[im 71/160]
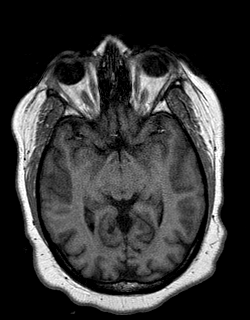
[im 89/160]
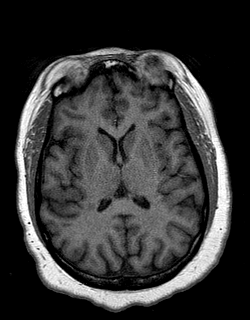
[im 107/160]
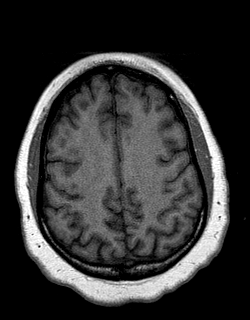
[im 124/160]
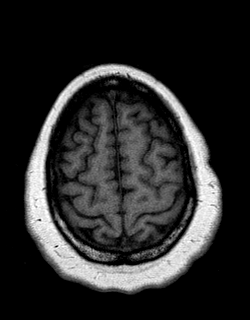
[im 142/160]
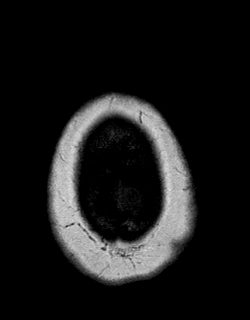
[im 160/160]
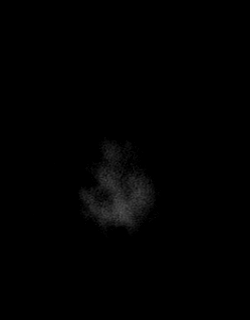

[Series 14: T2 · coronal · 5.0mm · 0.45mm/px · 2 of 26 slices shown]
[im 1/26]
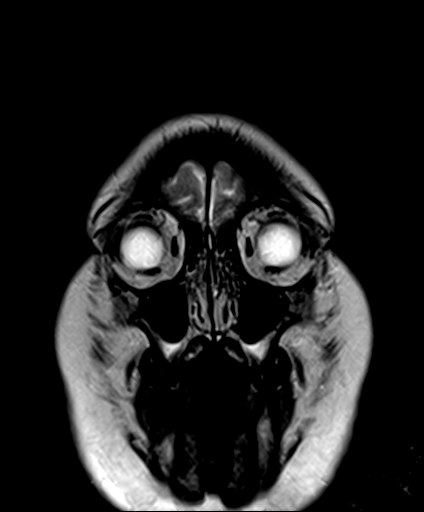
[im 26/26]
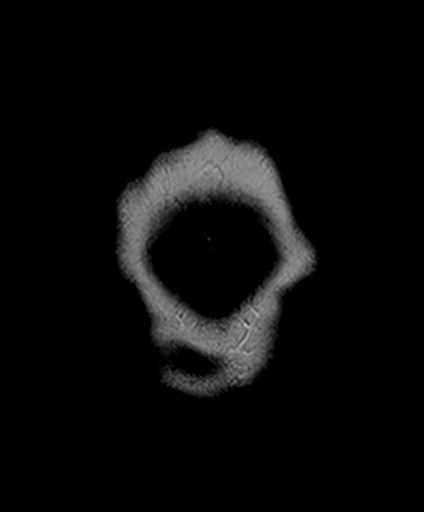

[Series 15: T1 post-contrast · coronal · 5.0mm · 0.72mm/px · 2 of 26 slices shown]
[im 1/26]
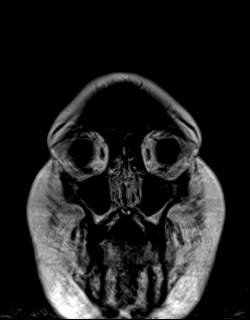
[im 26/26]
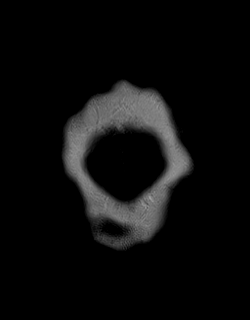

[Series 16: post t1_mpr_tra · axial · 1.0mm · 0.72mm/px · z∈[-61,+98]mm · 10 of 160 slices shown]
[im 1/160]
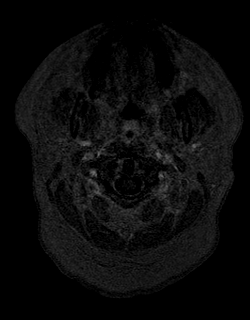
[im 18/160]
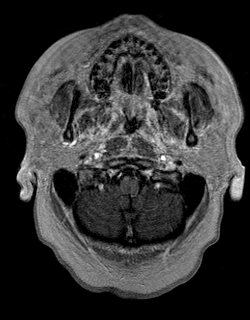
[im 36/160]
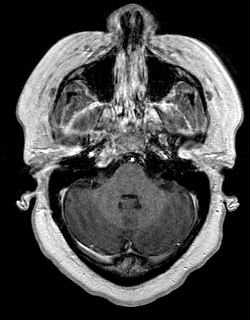
[im 54/160]
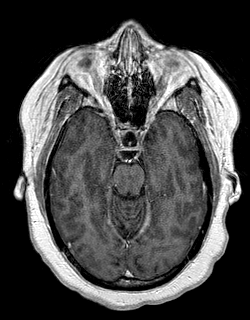
[im 71/160]
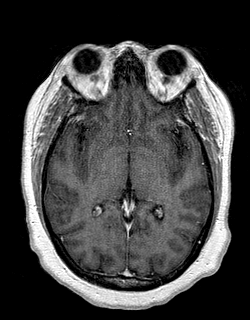
[im 89/160]
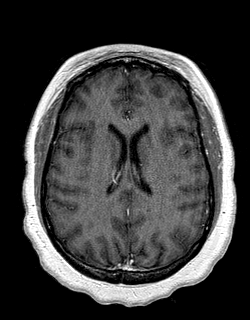
[im 107/160]
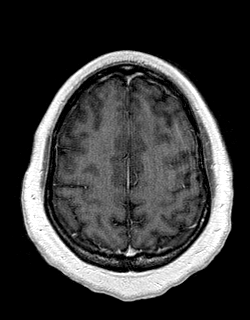
[im 124/160]
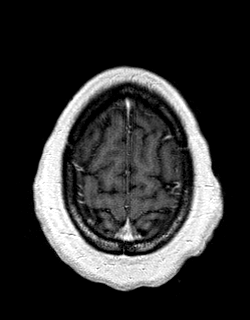
[im 142/160]
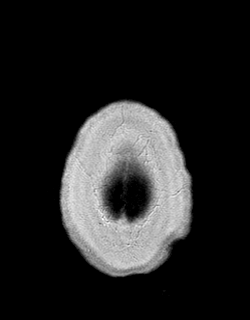
[im 160/160]
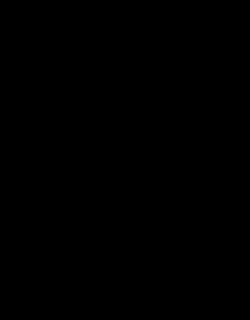

[48 of 48 positions shown; findings below may reference images not displayed]

FINDINGS: Brain: No acute infarction, hemorrhage, hydrocephalus, extra-axial
collection or mass lesion.

Scattered foci of T2 hyperintensity are seen within the white matter
of the cerebral hemispheres including deep, juxta cortical and
periventricular white matter. No posterior fossa lesion identified.
At least 3 in seen lesions are seen in the right posterior temporal
region, left frontal periventricular and right occipital lesion.

Vascular: Normal flow voids.

Skull and upper cervical spine: Normal marrow signal.

Sinuses/Orbits: Negative.
IMPRESSION: Scattered foci of T2 hyperintensity within the white matter of the
cerebral hemispheres including deep, juxta cortical and
periventricular white matter. At least 3 enhancing lesions are
identified. Findings are concerning for demyelinating disease such
as multiple sclerosis with active demyelination.

## 2020-11-25 MED ORDER — GADOBENATE DIMEGLUMINE 529 MG/ML IV SOLN
20.0000 mL | Freq: Once | INTRAVENOUS | Status: AC | PRN
  Administered 2020-11-25: 20 mL via INTRAVENOUS

## 2020-11-25 NOTE — Progress Notes (Addendum)
Brought to recovery at nurse's station after contrast reaction.  Reportedly had 50mg  Benadryl in MRI.  Pt is alert and calm.  Walked over from MRI with steady gait.  Dr in to see pt.  Pt reported that her tongue feels itchy but no airway distress  O2 sat is 100%.  Feels itchy in spots but not to the point of scratching.  Denied shortness of breath and also denied facial and tongue swelling.

## 2020-11-25 NOTE — Telephone Encounter (Signed)
Left message on patient's voicemail to call office to discuss MRI results.

## 2020-11-28 ENCOUNTER — Encounter: Payer: Self-pay | Admitting: Neurology

## 2020-11-28 NOTE — Telephone Encounter (Signed)
I have called and left a message for the patient to call the office to discuss the MRI.  My staff has contacted the patient and left a message several times to contact the office to schedule follow up to discuss results.  She has not returned the messages.  I will send a certified letter to the patient requesting that she contact the office

## 2020-12-04 ENCOUNTER — Telehealth: Payer: Self-pay | Admitting: Neurology

## 2020-12-04 NOTE — Telephone Encounter (Signed)
Called pt to advised dr.Jaffe off right now but he will give her a call tomorrow.  Per pt she will be off this week so she should be able to pick up the phone now.

## 2020-12-05 NOTE — Telephone Encounter (Signed)
Called pt to offer 2/18 @ 12:50p with Dr Everlena Cooper, but was sent to voicemail. Left message asking pt to call back to sch appt. Will also send a mychart message.

## 2020-12-05 NOTE — Telephone Encounter (Signed)
Adding: unable to send mychart message as pts mychart is not active

## 2020-12-10 ENCOUNTER — Telehealth: Payer: Self-pay | Admitting: Neurology

## 2020-12-10 NOTE — Telephone Encounter (Signed)
Called patient to see if she wanted to move appt to 12-11-20 and had to leave a message for her to call back

## 2020-12-23 ENCOUNTER — Encounter: Payer: Self-pay | Admitting: Neurology

## 2020-12-23 ENCOUNTER — Telehealth: Payer: Self-pay | Admitting: Neurology

## 2020-12-23 NOTE — Telephone Encounter (Signed)
Called and [facility] for patient to see if she would like the appt on 12-24-20 at 10:50

## 2020-12-24 ENCOUNTER — Encounter: Payer: Self-pay | Admitting: Neurology
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
## 2020-12-26 ENCOUNTER — Encounter: Payer: Self-pay | Admitting: Neurology

## 2020-12-26 ENCOUNTER — Other Ambulatory Visit: Payer: Self-pay

## 2020-12-26 ENCOUNTER — Ambulatory Visit (INDEPENDENT_AMBULATORY_CARE_PROVIDER_SITE_OTHER): Payer: No Typology Code available for payment source | Admitting: Neurology

## 2020-12-26 VITALS — BP 135/89 | HR 99 | Ht 64.0 in | Wt 359.4 lb

## 2020-12-26 DIAGNOSIS — G43109 Migraine with aura, not intractable, without status migrainosus: Secondary | ICD-10-CM | POA: Diagnosis not present

## 2020-12-26 DIAGNOSIS — G379 Demyelinating disease of central nervous system, unspecified: Secondary | ICD-10-CM | POA: Diagnosis not present

## 2020-12-26 MED ORDER — DIPHENHYDRAMINE HCL 50 MG PO TABS: ORAL_TABLET | ORAL | 0 refills | Status: DC

## 2020-12-26 MED ORDER — PREDNISONE 50 MG PO TABS: ORAL_TABLET | ORAL | 0 refills | Status: DC

## 2020-12-26 NOTE — Patient Instructions (Addendum)
1.  Will check MRI of cervical and thoracic spine with and without contrast.We have sent a referral to [facility] for your MRI and they will call you directly to schedule your appointment. They are located at [address] [facility]. If you need to contact them directly please call [phone]. Please take Benadryl and Prednisone medication sent to the pharmacy. Follow directions as written on the script before you schedule MRI.     2.  Prior to MRI, will have you take medication to prevent allergic reaction to the contrast - will contact you later 3.  Will have you follow up soon after MRI

## 2020-12-27 DIAGNOSIS — G35 Multiple sclerosis: Secondary | ICD-10-CM | POA: Insufficient documentation

## 2021-01-27 ENCOUNTER — Other Ambulatory Visit: Payer: Self-pay

## 2021-01-27 ENCOUNTER — Ambulatory Visit
Admission: RE | Admit: 2021-01-27 | Discharge: 2021-01-27 | Disposition: A | Discharge status: Home / Self Care | Payer: No Typology Code available for payment source | Source: Ambulatory Visit | Attending: Neurology | Admitting: Neurology

## 2021-01-27 DIAGNOSIS — G379 Demyelinating disease of central nervous system, unspecified: Secondary | ICD-10-CM

## 2021-01-27 IMAGING — MR MR CERVICAL SPINE WO/W CM
6 of 9 series · 30 of 48 positions shown · IV contrast (multihance)
Comparison: None.

CLINICAL DATA: Demyelinating disease.

EXAM:
MRI CERVICAL AND THORACIC SPINE WITHOUT AND WITH CONTRAST
TECHNIQUE: Multiplanar and multiecho pulse sequences of the cervical spine, to
include the craniocervical junction and cervicothoracic junction,
and the thoracic spine, were obtained without and with intravenous
contrast.
CONTRAST:  20mL MULTIHANCE GADOBENATE DIMEGLUMINE 529 MG/ML IV SOLN

[Series 9: T1 · sagittal · 3.0mm · 0.82mm/px · 3 of 19 slices shown (1 of 3)]
[im 1/19]
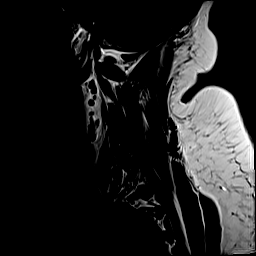
[im 10/19]
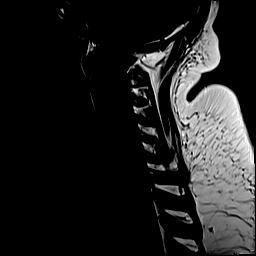
[im 19/19]
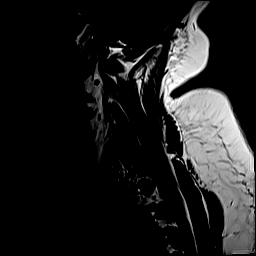

[Series 11: T2 · axial · 3.0mm · 0.62mm/px · z∈[-64,+63]mm · 7 of 41 slices shown (1 of 3)]
[im 1/41]
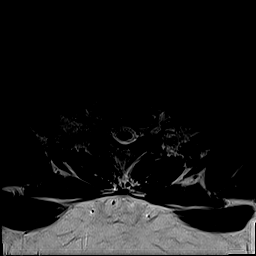
[im 7/41]
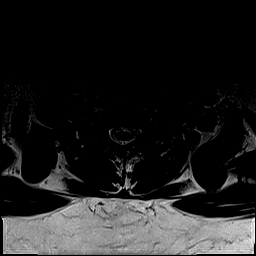
[im 14/41]
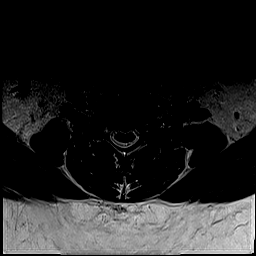
[im 21/41]
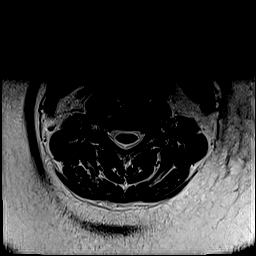
[im 27/41]
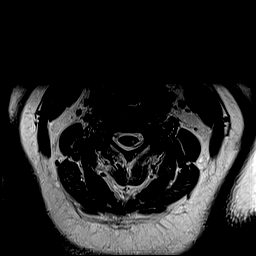
[im 34/41]
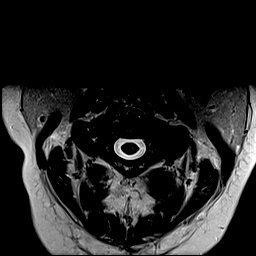
[im 41/41]
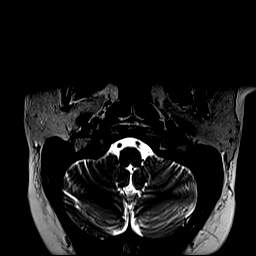

[Series 13: T1 · axial · non-contrast · 3.0mm · 0.31mm/px · z∈[-64,+63]mm · 8 of 41 slices shown (2 of 3)]
[im 1/41]
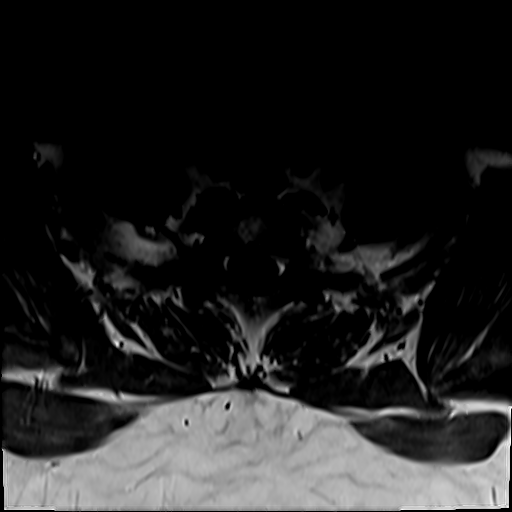
[im 6/41]
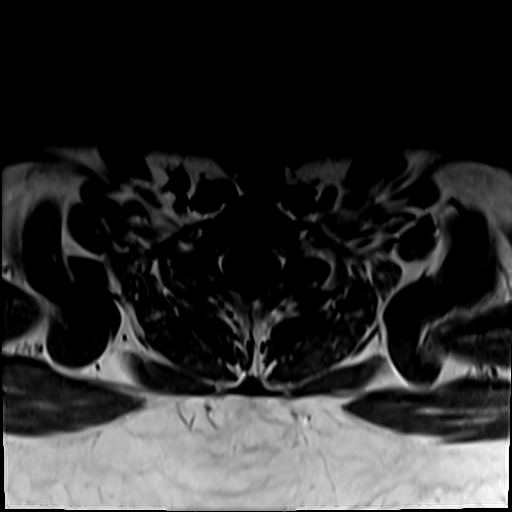
[im 12/41]
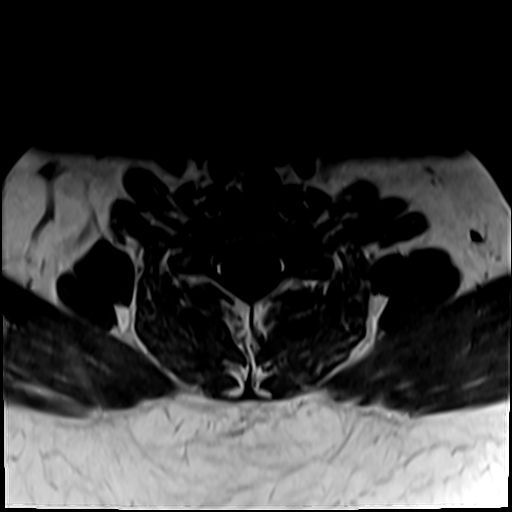
[im 18/41]
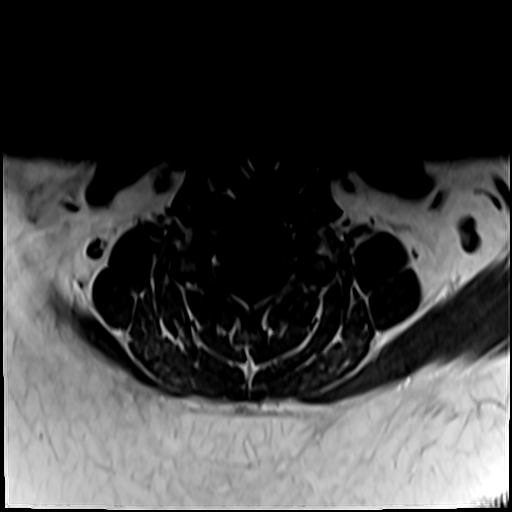
[im 23/41]
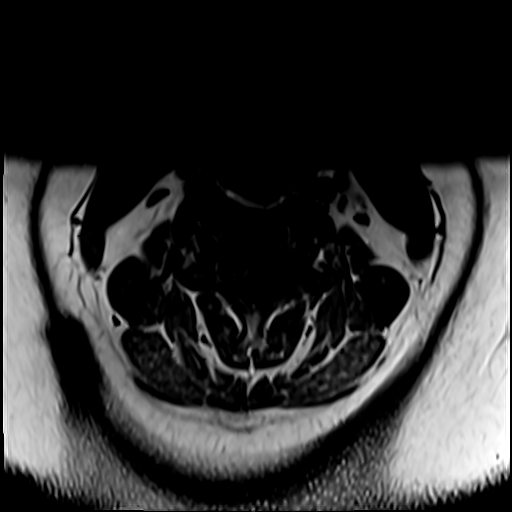
[im 29/41]
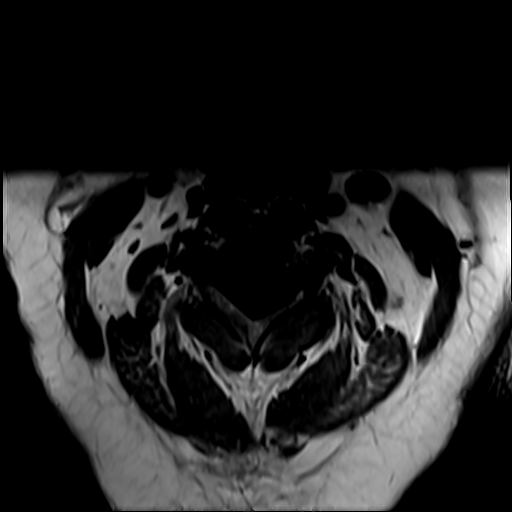
[im 35/41]
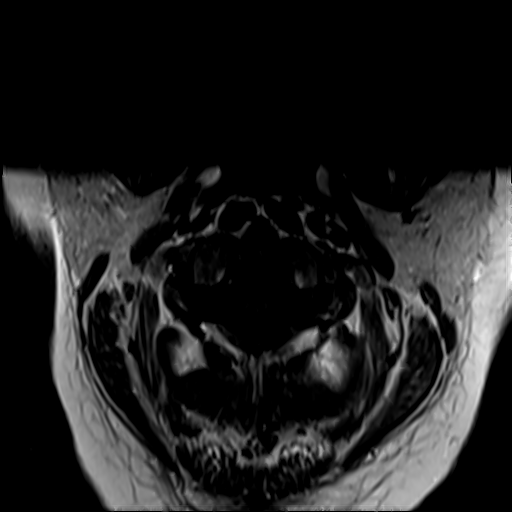
[im 41/41]
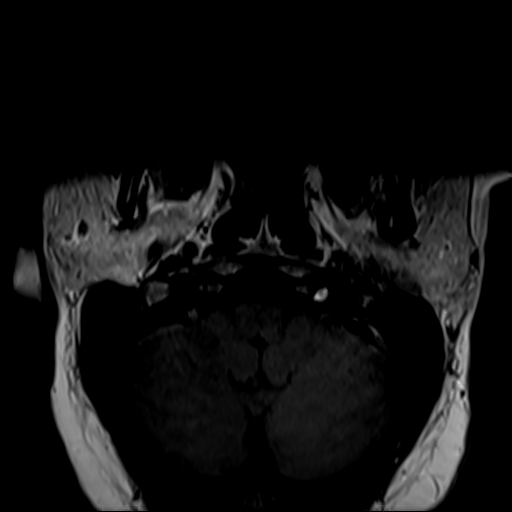

[Series 14: T2 · sagittal · 3.0mm · 0.66mm/px · 4 of 19 slices shown (2 of 3)]
[im 1/19]
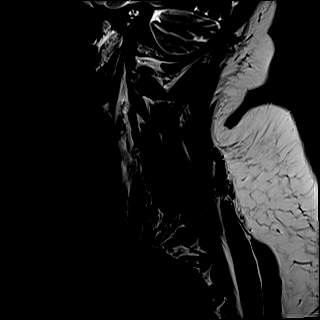
[im 7/19]
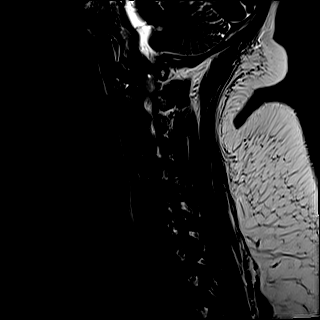
[im 13/19]
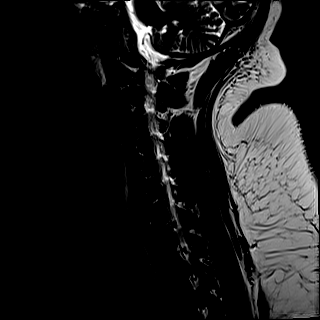
[im 19/19]
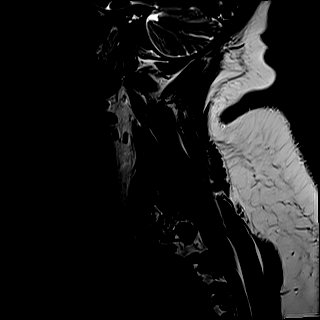

[Series 16: T1 · axial · 3.0mm · 0.31mm/px · z∈[-64,-10]mm · 4 of 41 slices shown (3 of 3)]
[im 1/41]
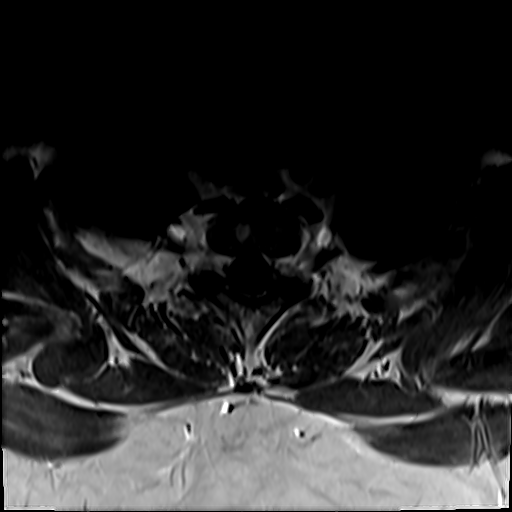
[im 6/41]
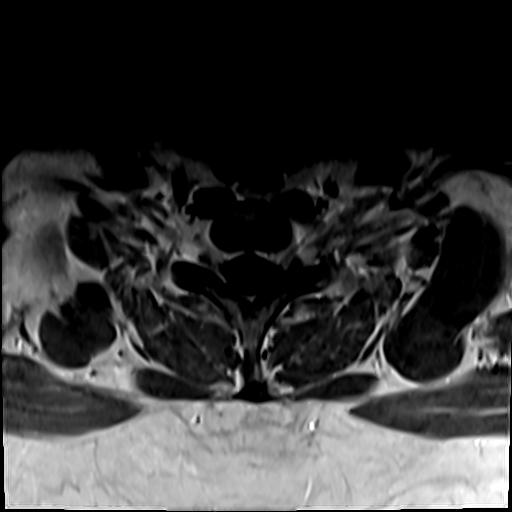
[im 12/41]
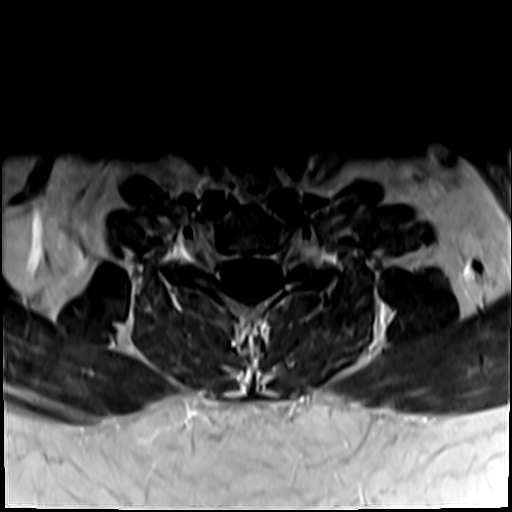
[im 18/41]
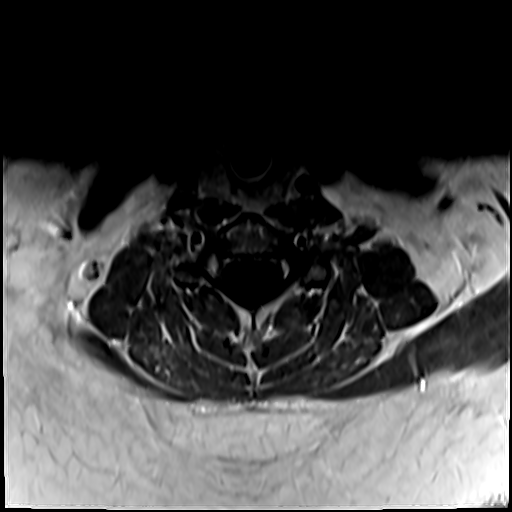

[Series 17: T2 · sagittal · 3.0mm · 0.66mm/px · 4 of 19 slices shown (3 of 3)]
[im 1/19]
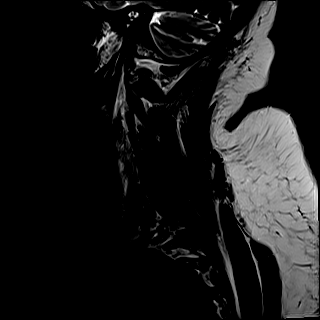
[im 7/19]
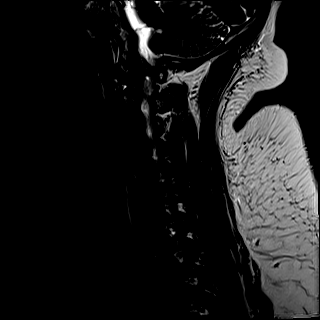
[im 13/19]
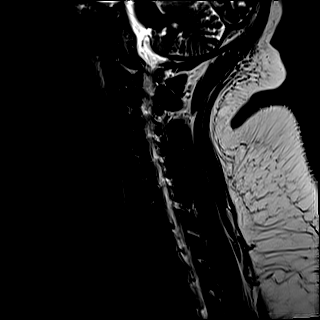
[im 19/19]
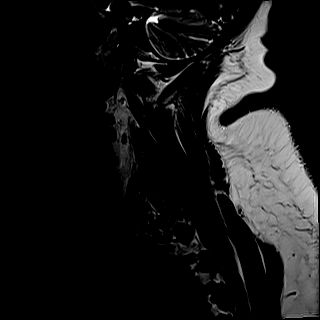

[30 of 48 positions shown; findings below may reference images not displayed]

FINDINGS: MRI CERVICAL SPINE FINDINGS

Alignment: No substantial sagittal subluxation. Straightening of the
normal cervical lordosis.

Vertebrae: Vertebral body heights are maintained. No specific
evidence of acute fracture, discitis/osteomyelitis, or suspicious
bone lesion.

Cord: Short-segment T2/STIR hyperintense lesion in the left dorsal
cord at the C3 level (see series 12, image 15 and series 10, image
11). No abnormal enhancement

Posterior Fossa, vertebral arteries, paraspinal tissues: Visualized
vertebral artery flow voids are maintained. Unremarkable visualized
posterior fossa on limited assessment.

Disc levels:

Mild multilevel degenerative change without significant canal or
foraminal stenosis. Posterior disc/osteophyte at C5-C6 contacts and
flattens the ventral cord.

MRI THORACIC SPINE FINDINGS

Motion limited evaluation.  Within this limitation:

Alignment:  No substantial sagittal subluxation.

Vertebrae: Vertebral body heights are maintained. No specific
evidence of acute fracture, discitis/osteomyelitis, or suspicious
bone lesion. Scattered T1 hyperintense vertebral venous
malformations (hemangiomas).

Cord: Motion limited evaluation without convincing cord signal
abnormality. New evidence of enhancement.

Paraspinal and other soft tissues: Negative.

Disc levels:

Mild multilevel degenerative change without significant canal or
foraminal stenosis.
IMPRESSION: Motion limited evaluation, particularly of the thoracic cord.

1. Short segment T2/STIR hyperintense lesion at the C3 level,
suspicious for an area of prior demyelination given the
characteristic appearance and the provided clinical history. No
abnormal enhancement to suggest active demyelination.
2. No significant canal or foraminal stenosis. Posterior
disc/osteophyte at C5-C6 contacts and flattens the ventral cord.

## 2021-01-27 IMAGING — MR MR THORACIC SPINE WO/W CM
8 of 10 series · 38 of 48 positions shown · IV contrast (multihance)
Comparison: None.

CLINICAL DATA: Demyelinating disease.

EXAM:
MRI CERVICAL AND THORACIC SPINE WITHOUT AND WITH CONTRAST
TECHNIQUE: Multiplanar and multiecho pulse sequences of the cervical spine, to
include the craniocervical junction and cervicothoracic junction,
and the thoracic spine, were obtained without and with intravenous
contrast.
CONTRAST:  20mL MULTIHANCE GADOBENATE DIMEGLUMINE 529 MG/ML IV SOLN

[Series 17: T1 · sagittal · 3.0mm · 1.00mm/px · 2 of 21 slices shown (1 of 2)]
[im 1/21]
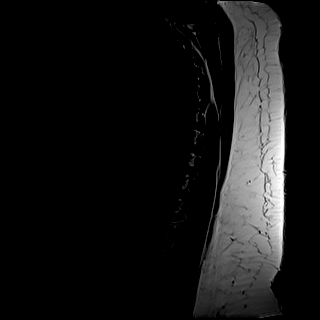
[im 21/21]
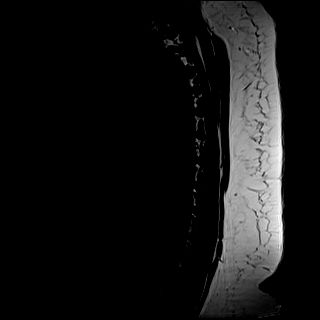

[Series 18: STIR · sagittal · 3.0mm · 1.25mm/px · 3 of 21 slices shown]
[im 1/21]
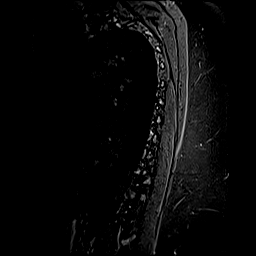
[im 11/21]
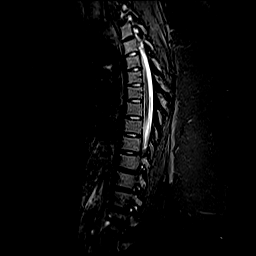
[im 21/21]
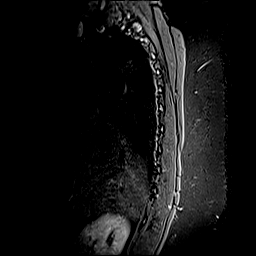

[Series 19: T2 · sagittal · 3.0mm · 0.83mm/px · 3 of 21 slices shown (1 of 3)]
[im 1/21]
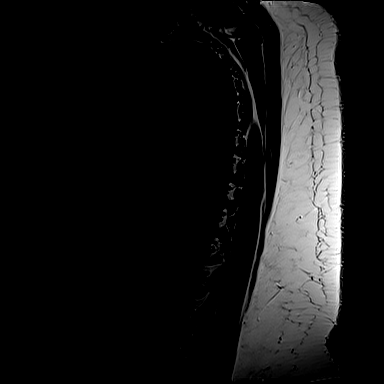
[im 11/21]
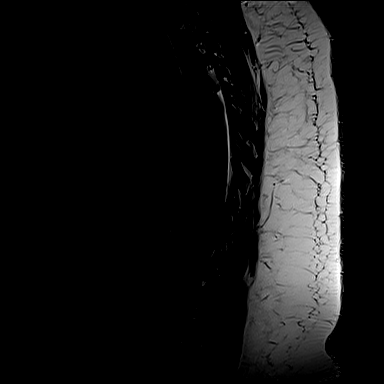
[im 21/21]
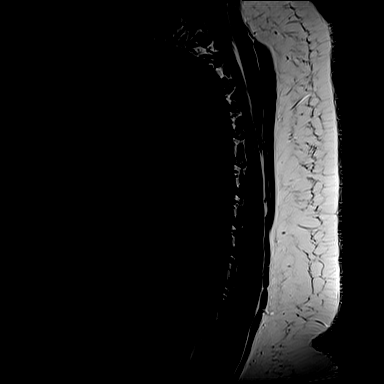

[Series 20: T2 · axial · 4.0mm · 0.35mm/px · z∈[-303,-19]mm · 8 of 59 slices shown (2 of 3)]
[im 1/59]
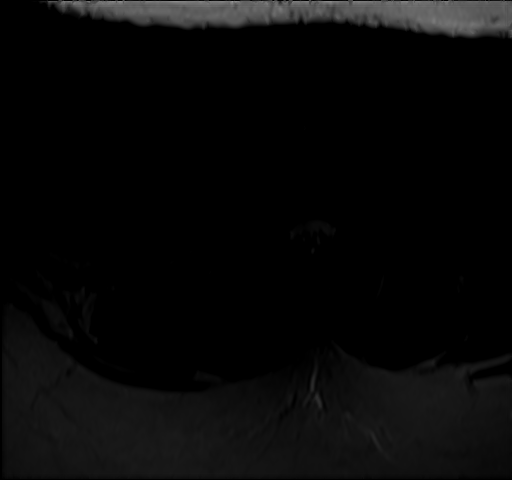
[im 9/59]
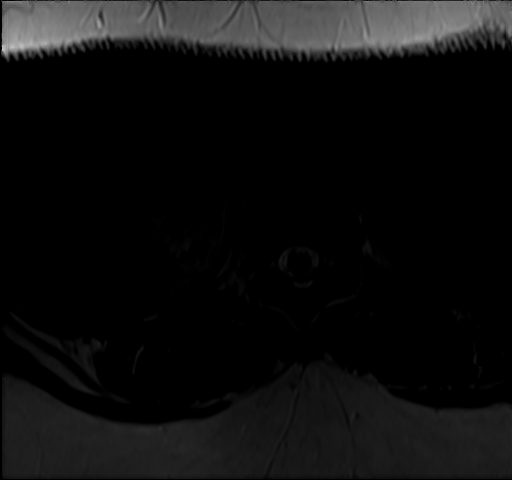
[im 17/59]
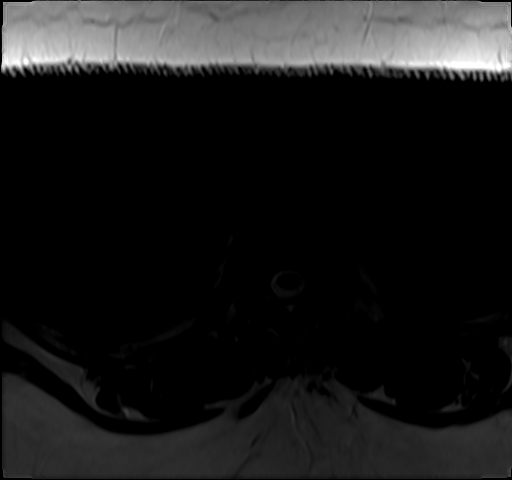
[im 25/59]
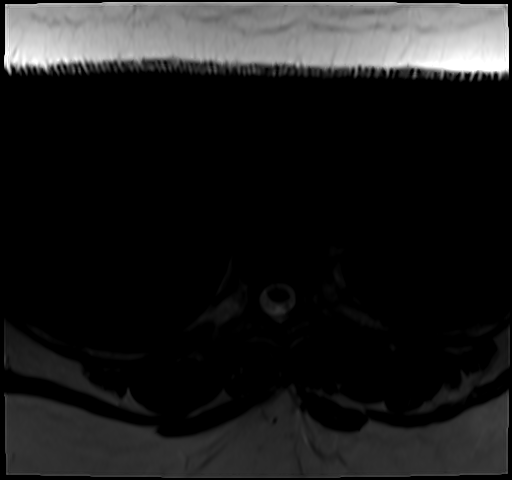
[im 34/59]
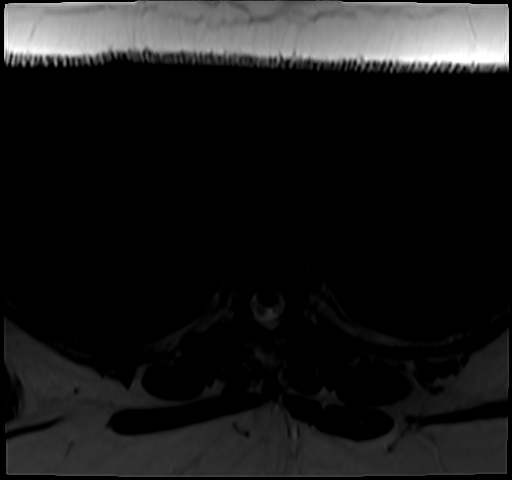
[im 42/59]
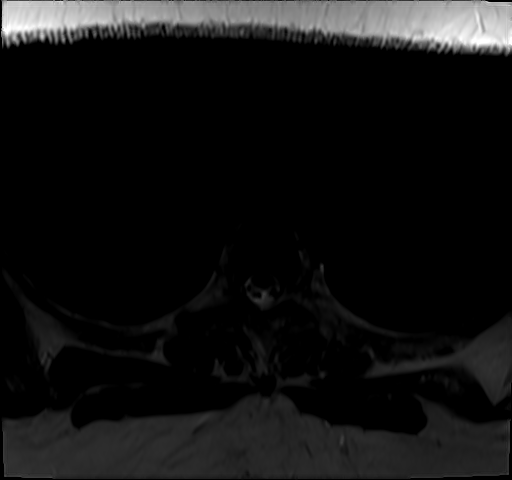
[im 50/59]
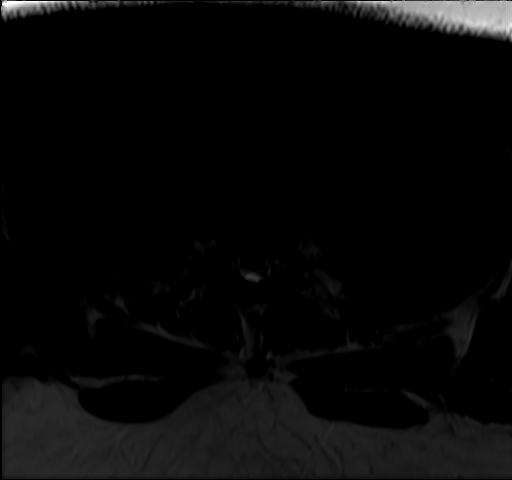
[im 59/59]
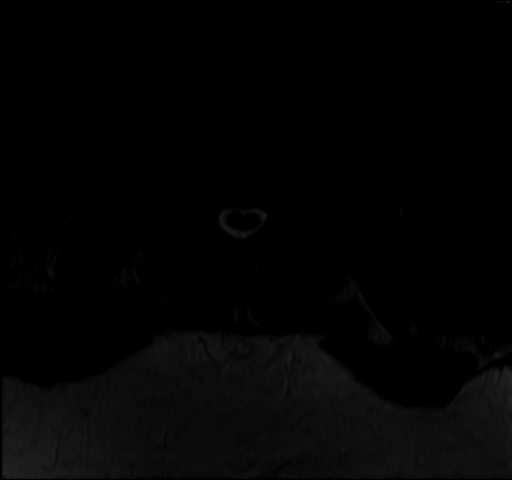

[Series 23: T1 · axial · non-contrast · 4.0mm · 0.70mm/px · z∈[-303,-19]mm · 8 of 59 slices shown (2 of 2)]
[im 1/59]
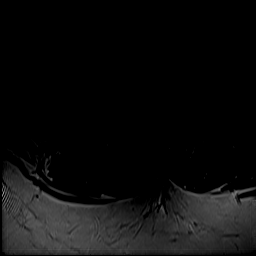
[im 9/59]
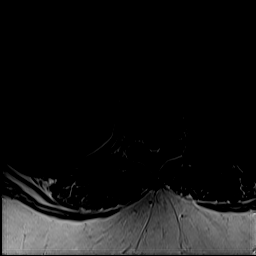
[im 17/59]
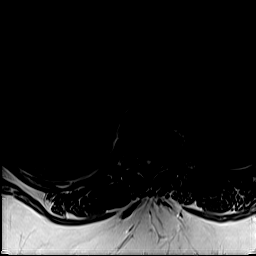
[im 25/59]
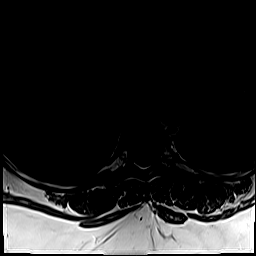
[im 34/59]
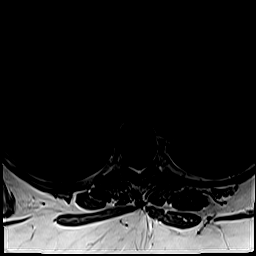
[im 42/59]
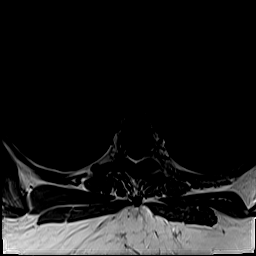
[im 50/59]
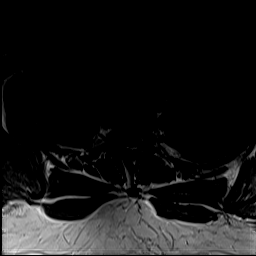
[im 59/59]
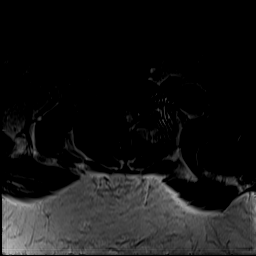

[Series 24: T2 · sagittal · 3.0mm · 0.83mm/px · 3 of 21 slices shown (3 of 3)]
[im 1/21]
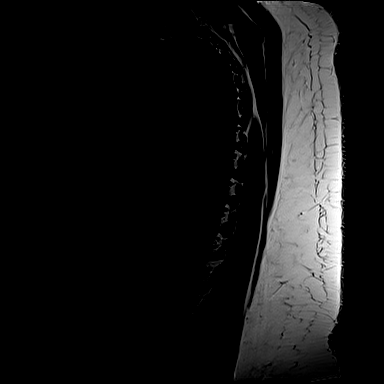
[im 11/21]
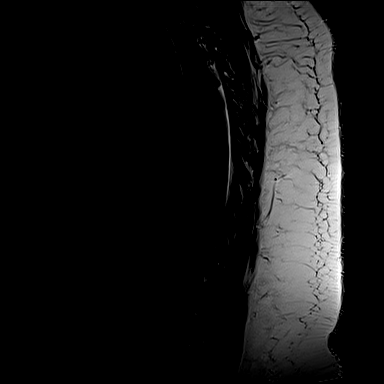
[im 21/21]
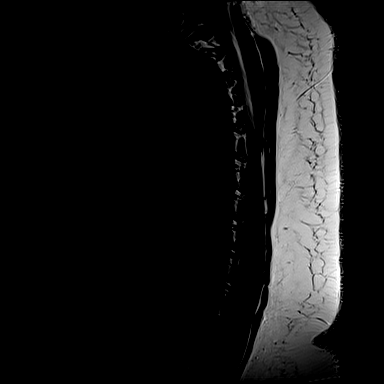

[Series 25: T1 fat-sat · sagittal · 3.0mm · 1.25mm/px · 3 of 21 slices shown]
[im 1/21]
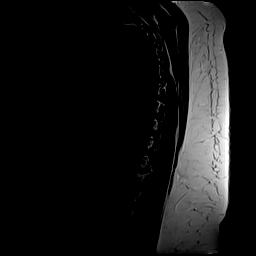
[im 11/21]
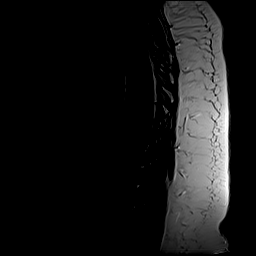
[im 21/21]
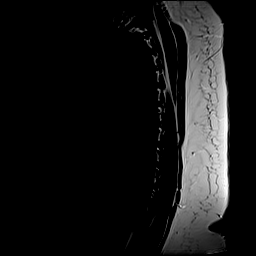

[Series 26: T1 post-contrast · axial · 4.0mm · 0.70mm/px · z∈[-303,-19]mm · 8 of 59 slices shown]
[im 1/59]
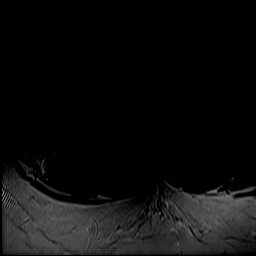
[im 9/59]
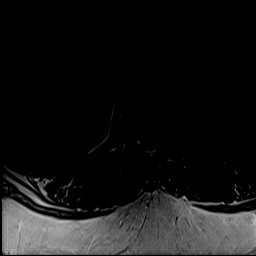
[im 17/59]
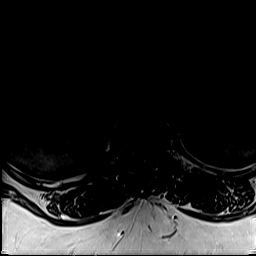
[im 25/59]
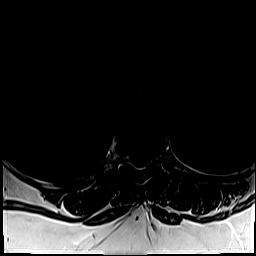
[im 34/59]
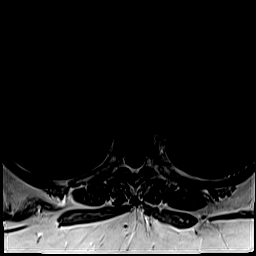
[im 42/59]
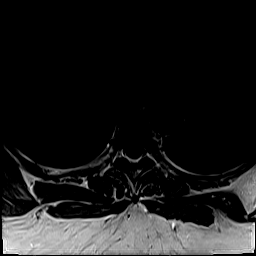
[im 50/59]
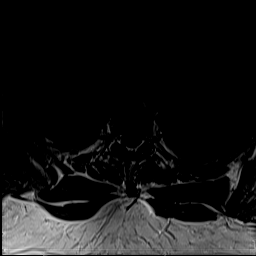
[im 59/59]
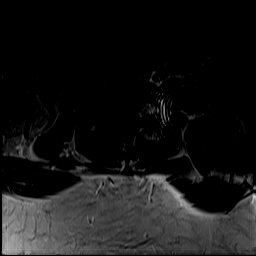

[38 of 48 positions shown; findings below may reference images not displayed]

FINDINGS: MRI CERVICAL SPINE FINDINGS

Alignment: No substantial sagittal subluxation. Straightening of the
normal cervical lordosis.

Vertebrae: Vertebral body heights are maintained. No specific
evidence of acute fracture, discitis/osteomyelitis, or suspicious
bone lesion.

Cord: Short-segment T2/STIR hyperintense lesion in the left dorsal
cord at the C3 level (see series 12, image 15 and series 10, image
11). No abnormal enhancement

Posterior Fossa, vertebral arteries, paraspinal tissues: Visualized
vertebral artery flow voids are maintained. Unremarkable visualized
posterior fossa on limited assessment.

Disc levels:

Mild multilevel degenerative change without significant canal or
foraminal stenosis. Posterior disc/osteophyte at C5-C6 contacts and
flattens the ventral cord.

MRI THORACIC SPINE FINDINGS

Motion limited evaluation.  Within this limitation:

Alignment:  No substantial sagittal subluxation.

Vertebrae: Vertebral body heights are maintained. No specific
evidence of acute fracture, discitis/osteomyelitis, or suspicious
bone lesion. Scattered T1 hyperintense vertebral venous
malformations (hemangiomas).

Cord: Motion limited evaluation without convincing cord signal
abnormality. New evidence of enhancement.

Paraspinal and other soft tissues: Negative.

Disc levels:

Mild multilevel degenerative change without significant canal or
foraminal stenosis.
IMPRESSION: Motion limited evaluation, particularly of the thoracic cord.

1. Short segment T2/STIR hyperintense lesion at the C3 level,
suspicious for an area of prior demyelination given the
characteristic appearance and the provided clinical history. No
abnormal enhancement to suggest active demyelination.
2. No significant canal or foraminal stenosis. Posterior
disc/osteophyte at C5-C6 contacts and flattens the ventral cord.

## 2021-01-27 MED ORDER — GADOBENATE DIMEGLUMINE 529 MG/ML IV SOLN
20.0000 mL | Freq: Once | INTRAVENOUS | Status: AC | PRN
  Administered 2021-01-27: 20 mL via INTRAVENOUS

## 2021-02-04 ENCOUNTER — Encounter: Payer: Self-pay | Admitting: Pulmonary Disease

## 2021-02-06 ENCOUNTER — Encounter: Payer: Self-pay | Admitting: Neurology

## 2021-02-06 ENCOUNTER — Ambulatory Visit (INDEPENDENT_AMBULATORY_CARE_PROVIDER_SITE_OTHER): Payer: No Typology Code available for payment source | Admitting: Neurology

## 2021-02-06 ENCOUNTER — Other Ambulatory Visit: Payer: Self-pay

## 2021-02-06 ENCOUNTER — Other Ambulatory Visit (INDEPENDENT_AMBULATORY_CARE_PROVIDER_SITE_OTHER): Payer: No Typology Code available for payment source

## 2021-02-06 VITALS — BP 148/93 | HR 99 | Ht 64.0 in | Wt 362.0 lb

## 2021-02-06 DIAGNOSIS — G35 Multiple sclerosis: Secondary | ICD-10-CM

## 2021-02-06 LAB — COMPREHENSIVE METABOLIC PANEL
ALT: 22 U/L (ref 0–35)
AST: 20 U/L (ref 0–37)
Albumin: 3.7 g/dL (ref 3.5–5.2)
Alkaline Phosphatase: 75 U/L (ref 39–117)
BUN: 8 mg/dL (ref 6–23)
CO2: 24 mEq/L (ref 19–32)
Calcium: 9.3 mg/dL (ref 8.4–10.5)
Chloride: 104 mEq/L (ref 96–112)
Creatinine, Ser: 0.73 mg/dL (ref 0.40–1.20)
GFR: 109.23 mL/min (ref >60.00)
Glucose, Bld: 89 mg/dL (ref 70–99)
Potassium: 3.9 mEq/L (ref 3.5–5.1)
Sodium: 136 mEq/L (ref 135–145)
Total Bilirubin: 0.5 mg/dL (ref 0.2–1.2)
Total Protein: 7.3 g/dL (ref 6.0–8.3)

## 2021-02-06 LAB — CBC WITH DIFFERENTIAL/PLATELET
Basophils Absolute: 0 10*3/uL (ref 0.0–0.1)
Basophils Relative: 0.2 % (ref 0.0–3.0)
Eosinophils Absolute: 0.3 10*3/uL (ref 0.0–0.7)
Eosinophils Relative: 3.2 % (ref 0.0–5.0)
HCT: 30.2 % — ABNORMAL LOW (ref 36.0–46.0)
Hemoglobin: 9.6 g/dL — ABNORMAL LOW (ref 12.0–15.0)
Lymphocytes Relative: 38.3 % (ref 12.0–46.0)
Lymphs Abs: 3.3 10*3/uL (ref 0.7–4.0)
MCHC: 31.8 g/dL (ref 30.0–36.0)
MCV: 64.5 fl — ABNORMAL LOW (ref 78.0–100.0)
Monocytes Absolute: 0.4 10*3/uL (ref 0.1–1.0)
Monocytes Relative: 4.9 % (ref 3.0–12.0)
Neutro Abs: 4.5 10*3/uL (ref 1.4–7.7)
Neutrophils Relative %: 53.4 % (ref 43.0–77.0)
Platelets: 271 10*3/uL (ref 150.0–400.0)
RBC: 4.68 Mil/uL (ref 3.87–5.11)
RDW: 22 % — ABNORMAL HIGH (ref 11.5–15.5)
WBC: 8.5 10*3/uL (ref 4.0–10.5)

## 2021-02-06 LAB — VITAMIN D 25 HYDROXY (VIT D DEFICIENCY, FRACTURES): VITD: 8.49 ng/mL — ABNORMAL LOW (ref 30.00–100.00)

## 2021-02-06 MED ORDER — GABAPENTIN 100 MG PO CAPS: ORAL_CAPSULE | ORAL | 0 refills | Status: DC

## 2021-02-06 NOTE — Patient Instructions (Signed)
1  Check CBC with diff, CMP, JC Virus antibody with index, Hepatitis B, quantitative immunoglobulin panel, TB, HIV, and vitamin D 2.  Start gabapentin - take as directed 3.  Plan would be to follow up in 6 months
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
## 2021-02-10 NOTE — Progress Notes (Signed)
Pt advised of lab results and to start D3

## 2021-02-11 LAB — STRATIFY JCV AB (W/ INDEX) W/ RFLX
Index Value: 0.21 — ABNORMAL HIGH
Stratify JCV (TM) Ab w/Reflex Inhibition: UNDETERMINED — AB

## 2021-02-11 LAB — RFLX STRATIFY JCV (TM) AB INHIBITION: JCV Antibody by Inhibition: NEGATIVE

## 2021-02-11 LAB — IGG, IGA, IGM
IgG (Immunoglobin G), Serum: 1056 mg/dL (ref 600–1640)
IgM, Serum: 169 mg/dL (ref 50–300)
Immunoglobulin A: 587 mg/dL — ABNORMAL HIGH (ref 47–310)

## 2021-02-11 LAB — QUANTIFERON-TB GOLD PLUS
Mitogen-NIL: >10 IU/mL
NIL: 0.03 IU/mL
QuantiFERON-TB Gold Plus: NEGATIVE
TB1-NIL: 0.02 IU/mL
TB2-NIL: 0.04 IU/mL

## 2021-02-11 LAB — ACUTE HEP PANEL AND HEP B SURFACE AB
HEPATITIS C ANTIBODY REFILL$(REFL): NONREACTIVE
Hep A IgM: NONREACTIVE
Hep B C IgM: NONREACTIVE
Hepatitis B Surface Ag: NONREACTIVE
SIGNAL TO CUT-OFF: 0 (ref <1.00)

## 2021-02-11 LAB — REFLEX TIQ

## 2021-02-11 LAB — HIV ANTIBODY (ROUTINE TESTING W REFLEX): HIV 1&2 Ab, 4th Generation: NONREACTIVE

## 2021-02-12 ENCOUNTER — Other Ambulatory Visit: Payer: Self-pay | Admitting: Neurology

## 2021-02-12 DIAGNOSIS — G35 Multiple sclerosis: Secondary | ICD-10-CM

## 2021-02-12 MED ORDER — D3-50 1.25 MG (50000 UT) PO CAPS: 50000.0000 [IU] | ORAL_CAPSULE | ORAL | 5 refills | Status: DC

## 2021-02-12 NOTE — Progress Notes (Signed)
Pre Dr.JAffe Order CBC W/Diff,CMP,JC Virus, MRI Brain W/W o Contrast.

## 2021-02-13 NOTE — Progress Notes (Signed)
Start Form filled out.  PA started.

## 2021-02-18 NOTE — Progress Notes (Signed)
Tysabri form filled out and waiting on PA

## 2021-02-26 ENCOUNTER — Institutional Professional Consult (permissible substitution): Payer: No Typology Code available for payment source | Admitting: Pulmonary Disease

## 2021-02-26 ENCOUNTER — Ambulatory Visit: Payer: No Typology Code available for payment source | Admitting: Neurology

## 2021-02-26 ENCOUNTER — Other Ambulatory Visit: Payer: Self-pay

## 2021-02-26 ENCOUNTER — Encounter: Payer: Self-pay | Admitting: Pulmonary Disease

## 2021-02-26 ENCOUNTER — Ambulatory Visit (INDEPENDENT_AMBULATORY_CARE_PROVIDER_SITE_OTHER): Payer: No Typology Code available for payment source | Admitting: Pulmonary Disease

## 2021-02-26 VITALS — BP 120/76 | HR 98 | Temp 97.7°F | Ht 64.0 in | Wt 362.2 lb

## 2021-02-26 DIAGNOSIS — Z6841 Body Mass Index (BMI) 40.0 and over, adult: Secondary | ICD-10-CM | POA: Diagnosis not present

## 2021-02-26 DIAGNOSIS — R400 Somnolence: Secondary | ICD-10-CM | POA: Diagnosis not present

## 2021-02-26 MED ORDER — RAMELTEON 8 MG PO TABS: 8.0000 mg | ORAL_TABLET | Freq: Every day | ORAL | 3 refills | Status: DC

## 2021-02-26 NOTE — Patient Instructions (Signed)
Moderate probability of significant obstructive sleep apnea  We will schedule you for split-night study-study to be done in the lab  Sleep onset insomnia -Trial with ramelteon  I will see you back in 3 to 4 months  Continue weight loss efforts

## 2021-02-26 NOTE — Progress Notes (Signed)
Michelle Velasquez    [id number]    March 23, 1991  Primary Care Physician:Patient, No Pcp Per (Inactive)  Referring Physician: Drema Dallas, DO [address] [place],  [place] [address]  Chief complaint:   Patient with difficulty falling asleep, difficulty staying asleep, snoring  HPI:  Longstanding history of snoring History of sleep terrors  History of ADD, PCOS  Significant weight gain, she is active trying to get weight off  Usually goes to bed between 10 and 11 Takes hours to fall asleep 3-4 awakenings Final wake up time about 630  Admits to dryness of her mouth in the mornings Admits to night sweats Memory is challenging  Dad has obstructive sleep apnea  Does not smoke   Outpatient Encounter Medications as of 02/26/2021  Medication Sig  . Cholecalciferol (D3-50) 1.25 MG (50000 UT) capsule Take 1 capsule (50,000 Units total) by mouth every 7 (seven) days.  Marland Kitchen gabapentin (NEURONTIN) 100 MG capsule Take 1 capsule three times daily for one week, then 2 capsules three times daily for one week, then 3 capsules three times daily.  . ondansetron (ZOFRAN) 4 MG tablet Take 1-2 tablets (4-8 mg total) by mouth every 8 (eight) hours as needed.  . ramelteon (ROZEREM) 8 MG tablet Take 1 tablet (8 mg total) by mouth at bedtime.  . rizatriptan (MAXALT-MLT) 10 MG disintegrating tablet Take 1 tablet earliest onset of migraine.  May repeat in 2 hours if needed.  Maximum 2 tablets in 24 hours.  . topiramate (TOPAMAX) 50 MG tablet Take 1 tablet (50 mg total) by mouth at bedtime.  . diphenhydrAMINE (BENADRYL) 50 MG tablet Take 1 tablet within 1 hour before injection (Patient not taking: Reported on 02/26/2021)  . predniSONE (DELTASONE) 50 MG tablet Take 1 tablet 13 hours, 1 tablet 7 hours and 1 tablet 1 hour before injection. (Patient not taking: Reported on 02/26/2021)   No facility-administered encounter medications on file as of 02/26/2021.    Allergies as of  02/26/2021 - Review Complete 02/26/2021  Allergen Reaction Noted  . Sulfa antibiotics Anaphylaxis 01/09/2020  . Gadolinium derivatives Itching 11/25/2020  . Grass pollen(k-o-r-t-swt vern) Hives 05/16/2020    Past Medical History:  Diagnosis Date  . Headache   . PCOS (polycystic ovarian syndrome)     History reviewed. No pertinent surgical history.  Family History  Problem Relation Age of Onset  . Diabetes Father   . Multiple sclerosis Mother   . Fibroids Mother   . Fibromyalgia Mother     Social History   Socioeconomic History  . Marital status: Single    Spouse name: Not on file  . Number of children: Not on file  . Years of education: Not on file  . Highest education level: Not on file  Occupational History  . Not on file  Tobacco Use  . Smoking status: Never Smoker  . Smokeless tobacco: Never Used  Vaping Use  . Vaping Use: Never used  Substance and Sexual Activity  . Alcohol use: Never  . Drug use: Never  . Sexual activity: Yes  Other Topics Concern  . Not on file  Social History Narrative   Right handed   Social Determinants of Health   Financial Resource Strain: Not on file  Food Insecurity: Not on file  Transportation Needs: Not on file  Physical Activity: Not on file  Stress: Not on file  Social Connections: Not on file  Intimate Partner Violence: Not  on file    Review of Systems  Constitutional: Positive for fatigue and unexpected weight change.  HENT: Positive for sore throat.   Gastrointestinal: Positive for abdominal pain.  Musculoskeletal: Positive for joint swelling.  Psychiatric/Behavioral:       Depression, anxiety    Vitals:   02/26/21 1559  BP: 120/76  Pulse: 98  Temp: 97.7 F (36.5 C)  SpO2: 100%     Physical Exam Constitutional:      Appearance: She is obese.  HENT:     Head: Normocephalic and atraumatic.     Nose: No congestion or rhinorrhea.     Mouth/Throat:     Mouth: Mucous membranes are moist.     Comments:  Microstomia, macroglossia, Mallampati 4 Eyes:     General:        Right eye: No discharge.     Pupils: Pupils are equal, round, and reactive to light.  Cardiovascular:     Rate and Rhythm: Normal rate and regular rhythm.     Heart sounds: No murmur heard. No friction rub.  Pulmonary:     Effort: Pulmonary effort is normal. No respiratory distress.     Breath sounds: No stridor. No wheezing or rhonchi.  Musculoskeletal:     Cervical back: No rigidity or tenderness.  Neurological:     Mental Status: She is alert.  Psychiatric:        Mood and Affect: Mood normal.    Results of the Epworth flowsheet 02/26/2021  Sitting and reading 1  Watching TV 1  Sitting, inactive in a public place (e.g. a theatre or a meeting) 0  As a passenger in a car for an hour without a break 0  Lying down to rest in the afternoon when circumstances permit 2  Sitting and talking to someone 0  Sitting quietly after a lunch without alcohol 0  In a car, while stopped for a few minutes in traffic 0  Total score 4    Assessment:  High probability of significant obstructive sleep apnea  Nonrestorative sleep  Multiple awakenings  The likelihood of a home sleep study being falsely negative is high-will benefit from an in lab split-night study  History of sleep terrors  Polycystic ovarian syndrome  Sleep onset insomnia  Pathophysiology of sleep disordered breathing discussed with the patient Treatment options for sleep disordered breathing discussed with the patient  Plan/Recommendations: We will schedule the patient for an in lab split-night study  Encouraged to call with any significant concerns  Encouraged to continue working on weight loss efforts  Tentative follow-up in 3 to 4 months   Virl Diamond MD [facility] 02/26/2021, 4:27 PM  CC: Drema Dallas, DO

## 2021-03-13 ENCOUNTER — Other Ambulatory Visit: Payer: Self-pay | Admitting: Neurology

## 2021-03-13 ENCOUNTER — Telehealth: Payer: Self-pay | Admitting: Neurology

## 2021-03-13 DIAGNOSIS — G35 Multiple sclerosis: Secondary | ICD-10-CM | POA: Insufficient documentation

## 2021-03-13 NOTE — Telephone Encounter (Signed)
Michelle Velasquez with [employer] called and requested Tysabri. They are needing they whole form refaxed, page 2 was not signed by the patient, page 3 missing.  Fax: [phone]

## 2021-03-13 NOTE — Telephone Encounter (Signed)
Patient no answer at 35005/26/2022

## 2021-03-14 ENCOUNTER — Telehealth: Payer: Self-pay | Admitting: Pharmacy Technician

## 2021-03-14 NOTE — Telephone Encounter (Signed)
Auth Submission:PENDING  Payer: [employer]/ [employer] Medication & CPT/J Code(s) submitted: Tysabri (Natalizumab) [phone] Route of submission (phone, fax, portal): PHOHE [phone] FAX [phone] Auth type: Buy/Bill Units/visits requested: 12 Reference number:[id number].   Will update once we receive a response.

## 2021-03-14 NOTE — Telephone Encounter (Signed)
Pt stop by and sign pg 2

## 2021-03-20 NOTE — Telephone Encounter (Signed)
Auth Submission: APPROVED Payer: BAS Medication & CPT/J Code(s) submitted: TYSABRI ([id number]) Route of submission (phone, fax, portal):PHONE [phone] Auth type: BUY/BILL Units/visits requested: 12 Reference number: [id number]  [facility] is in net-work. Patient has OPEN NET-WORK PLAN (REF# [id number])

## 2021-03-29 ENCOUNTER — Other Ambulatory Visit: Payer: Self-pay | Admitting: Neurology

## 2021-03-31 ENCOUNTER — Other Ambulatory Visit: Payer: Self-pay

## 2021-04-03 ENCOUNTER — Other Ambulatory Visit: Payer: Self-pay

## 2021-04-03 ENCOUNTER — Ambulatory Visit (INDEPENDENT_AMBULATORY_CARE_PROVIDER_SITE_OTHER): Payer: No Typology Code available for payment source

## 2021-04-03 VITALS — BP 136/81 | HR 81 | Temp 98.2°F | Resp 18

## 2021-04-03 DIAGNOSIS — G35 Multiple sclerosis: Secondary | ICD-10-CM | POA: Diagnosis not present

## 2021-04-03 MED ORDER — LORATADINE 10 MG PO TABS
10.0000 mg | ORAL_TABLET | Freq: Once | ORAL | Status: AC
  Administered 2021-04-03: 10 mg via ORAL
  Filled 2021-04-03: qty 1

## 2021-04-03 MED ORDER — ACETAMINOPHEN 325 MG PO TABS
650.0000 mg | ORAL_TABLET | Freq: Once | ORAL | Status: AC
  Administered 2021-04-03: 650 mg via ORAL
  Filled 2021-04-03: qty 2

## 2021-04-03 MED ORDER — SODIUM CHLORIDE 0.9 % IV SOLN: INTRAVENOUS | Status: DC

## 2021-04-03 MED ORDER — SODIUM CHLORIDE 0.9 % IV SOLN
300.0000 mg | Freq: Once | INTRAVENOUS | Status: AC
  Administered 2021-04-03: 300 mg via INTRAVENOUS
  Filled 2021-04-03: qty 15

## 2021-04-03 NOTE — Progress Notes (Signed)
Diagnosis: Multiple Sclerosis  Provider:  Chilton Greathouse, MD  Procedure: Infusion  IV Type: Peripheral, IV Location: L Antecubital  Tysabri (Natalizumab), Dose: 300 mg  Infusion Start Time: 1111  Infusion Stop Time: 1211  Post Infusion IV Care: Observation period completed  Discharge: Condition: Good, Destination: Home . AVS provided to patient.   Performed by:  Marilynn Rail, RN

## 2021-04-05 ENCOUNTER — Encounter (HOSPITAL_BASED_OUTPATIENT_CLINIC_OR_DEPARTMENT_OTHER): Payer: Self-pay

## 2021-04-24 NOTE — Progress Notes (Unsigned)
[facility] infusion [address], site authorization number [id number] approved Michelle Velasquez is authorized through 10/02/2021. Sent to scan.

## 2021-04-29 ENCOUNTER — Ambulatory Visit: Payer: BC Managed Care – PPO | Admitting: Neurology

## 2021-05-01 ENCOUNTER — Ambulatory Visit (INDEPENDENT_AMBULATORY_CARE_PROVIDER_SITE_OTHER): Payer: No Typology Code available for payment source

## 2021-05-01 ENCOUNTER — Other Ambulatory Visit: Payer: Self-pay

## 2021-05-01 VITALS — BP 126/81 | HR 77 | Temp 97.9°F | Resp 16

## 2021-05-01 DIAGNOSIS — G35 Multiple sclerosis: Secondary | ICD-10-CM | POA: Diagnosis not present

## 2021-05-01 MED ORDER — SODIUM CHLORIDE 0.9 % IV SOLN: INTRAVENOUS | Status: DC

## 2021-05-01 MED ORDER — ACETAMINOPHEN 325 MG PO TABS
650.0000 mg | ORAL_TABLET | Freq: Once | ORAL | Status: AC
  Administered 2021-05-01: 650 mg via ORAL
  Filled 2021-05-01: qty 2

## 2021-05-01 MED ORDER — LORATADINE 10 MG PO TABS
10.0000 mg | ORAL_TABLET | Freq: Once | ORAL | Status: AC
Start: 2021-05-01 — End: 2021-05-01
  Administered 2021-05-01: 10 mg via ORAL
  Filled 2021-05-01: qty 1

## 2021-05-01 MED ORDER — SODIUM CHLORIDE 0.9 % IV SOLN
300.0000 mg | Freq: Once | INTRAVENOUS | Status: AC
  Administered 2021-05-01: 300 mg via INTRAVENOUS
  Filled 2021-05-01: qty 15

## 2021-05-01 NOTE — Progress Notes (Signed)
Diagnosis: Multiple Sclerosis  Provider:  Chilton Greathouse, MD  Procedure: Infusion  IV Type: Peripheral, IV Location: L Antecubital  Tysabri (Natalizumab), Dose: 300 mg  Infusion Start Time: 0925  Infusion Stop Time: 1025  Post Infusion IV Care: Observation period completed  Discharge: Condition: Good, Destination: Home . AVS provided to patient.   Performed by:  Marilynn Rail, RN

## 2021-05-04 ENCOUNTER — Encounter (HOSPITAL_BASED_OUTPATIENT_CLINIC_OR_DEPARTMENT_OTHER): Payer: No Typology Code available for payment source | Admitting: Pulmonary Disease

## 2021-05-13 ENCOUNTER — Other Ambulatory Visit: Payer: Self-pay | Admitting: Pulmonary Disease

## 2021-05-13 DIAGNOSIS — R0683 Snoring: Secondary | ICD-10-CM

## 2021-05-19 ENCOUNTER — Ambulatory Visit: Payer: No Typology Code available for payment source | Admitting: Nurse Practitioner

## 2021-05-29 ENCOUNTER — Ambulatory Visit (INDEPENDENT_AMBULATORY_CARE_PROVIDER_SITE_OTHER): Payer: No Typology Code available for payment source

## 2021-05-29 ENCOUNTER — Other Ambulatory Visit: Payer: Self-pay

## 2021-05-29 VITALS — BP 129/84 | HR 79 | Temp 98.1°F | Resp 18

## 2021-05-29 DIAGNOSIS — G35 Multiple sclerosis: Secondary | ICD-10-CM

## 2021-05-29 MED ORDER — LORATADINE 10 MG PO TABS
10.0000 mg | ORAL_TABLET | Freq: Once | ORAL | Status: AC
  Administered 2021-05-29: 10 mg via ORAL
  Filled 2021-05-29: qty 1

## 2021-05-29 MED ORDER — SODIUM CHLORIDE 0.9 % IV SOLN: INTRAVENOUS | Status: DC

## 2021-05-29 MED ORDER — SODIUM CHLORIDE 0.9 % IV SOLN
300.0000 mg | Freq: Once | INTRAVENOUS | Status: AC
  Administered 2021-05-29: 300 mg via INTRAVENOUS
  Filled 2021-05-29 (×2): qty 15

## 2021-05-29 MED ORDER — ACETAMINOPHEN 325 MG PO TABS
650.0000 mg | ORAL_TABLET | Freq: Once | ORAL | Status: AC
  Administered 2021-05-29: 650 mg via ORAL
  Filled 2021-05-29: qty 2

## 2021-05-29 NOTE — Progress Notes (Signed)
Diagnosis: Multiple Sclerosis  Provider:  Chilton Greathouse, MD  Procedure: Infusion  IV Type: Peripheral, IV Location: R Antecubital  Tysabri (Natalizumab), Dose: 300 mg  Infusion Start Time: 1030  Infusion Stop Time: 1130  Post Infusion IV Care: Observation period completed  Discharge: Condition: Good, Destination: Home . AVS provided to patient.   Performed by:  Marilynn Rail, RN

## 2021-06-09 ENCOUNTER — Other Ambulatory Visit: Payer: Self-pay | Admitting: Neurology

## 2021-06-26 ENCOUNTER — Other Ambulatory Visit: Payer: Self-pay

## 2021-06-26 ENCOUNTER — Ambulatory Visit (INDEPENDENT_AMBULATORY_CARE_PROVIDER_SITE_OTHER): Payer: No Typology Code available for payment source

## 2021-06-26 VITALS — BP 118/87 | HR 82 | Temp 97.8°F | Resp 18

## 2021-06-26 DIAGNOSIS — G35 Multiple sclerosis: Secondary | ICD-10-CM

## 2021-06-26 MED ORDER — HEPARIN SOD (PORK) LOCK FLUSH 100 UNIT/ML IV SOLN: 250.0000 [IU] | Freq: Once | INTRAVENOUS | Status: DC | PRN

## 2021-06-26 MED ORDER — METHYLPREDNISOLONE SODIUM SUCC 125 MG IJ SOLR: 125.0000 mg | Freq: Once | INTRAMUSCULAR | Status: DC | PRN

## 2021-06-26 MED ORDER — SODIUM CHLORIDE 0.9 % IV SOLN
300.0000 mg | Freq: Once | INTRAVENOUS | Status: AC
  Administered 2021-06-26: 300 mg via INTRAVENOUS
  Filled 2021-06-26 (×2): qty 15

## 2021-06-26 MED ORDER — EPINEPHRINE 0.3 MG/0.3ML IJ SOAJ: 0.3000 mg | Freq: Once | INTRAMUSCULAR | Status: DC | PRN

## 2021-06-26 MED ORDER — ACETAMINOPHEN 325 MG PO TABS
650.0000 mg | ORAL_TABLET | Freq: Once | ORAL | Status: AC
  Administered 2021-06-26: 650 mg via ORAL
  Filled 2021-06-26: qty 2

## 2021-06-26 MED ORDER — LORATADINE 10 MG PO TABS
10.0000 mg | ORAL_TABLET | Freq: Once | ORAL | Status: AC
  Administered 2021-06-26: 10 mg via ORAL
  Filled 2021-06-26: qty 1

## 2021-06-26 MED ORDER — FAMOTIDINE IN NACL 20-0.9 MG/50ML-% IV SOLN: 20.0000 mg | Freq: Once | INTRAVENOUS | Status: DC | PRN

## 2021-06-26 MED ORDER — HEPARIN SOD (PORK) LOCK FLUSH 100 UNIT/ML IV SOLN: 500.0000 [IU] | Freq: Once | INTRAVENOUS | Status: DC | PRN

## 2021-06-26 MED ORDER — ANTICOAGULANT SODIUM CITRATE 4% (200MG/5ML) IV SOLN: 5.0000 mL | Freq: Once | Status: DC | PRN

## 2021-06-26 MED ORDER — ALTEPLASE 2 MG IJ SOLR: 2.0000 mg | Freq: Once | INTRAMUSCULAR | Status: DC | PRN

## 2021-06-26 MED ORDER — SODIUM CHLORIDE 0.9 % IV SOLN: INTRAVENOUS | Status: DC

## 2021-06-26 MED ORDER — SODIUM CHLORIDE 0.9% FLUSH: 10.0000 mL | Freq: Once | INTRAVENOUS | Status: DC | PRN

## 2021-06-26 MED ORDER — SODIUM CHLORIDE 0.9 % IV SOLN: Freq: Once | INTRAVENOUS | Status: DC | PRN

## 2021-06-26 MED ORDER — SODIUM CHLORIDE 0.9% FLUSH: 3.0000 mL | Freq: Once | INTRAVENOUS | Status: DC | PRN

## 2021-06-26 MED ORDER — ALBUTEROL SULFATE HFA 108 (90 BASE) MCG/ACT IN AERS: 2.0000 | INHALATION_SPRAY | Freq: Once | RESPIRATORY_TRACT | Status: DC | PRN

## 2021-06-26 MED ORDER — DIPHENHYDRAMINE HCL 50 MG/ML IJ SOLN: 50.0000 mg | Freq: Once | INTRAMUSCULAR | Status: DC | PRN

## 2021-06-26 NOTE — Progress Notes (Signed)
Diagnosis: Multiple Sclerosis  Provider:  Chilton Greathouse, MD  Procedure: Infusion  IV Type: Peripheral, IV Location: L Antecubital  Tysabri (Natalizumab), Dose: 300 mg  Infusion Start Time: 1000  Infusion Stop Time: 1126  Post Infusion IV Care: Observation period completed and Peripheral IV Discontinued  Discharge: Condition: Good, Destination: Home . AVS provided to patient.   Performed by:  Marguarite Arbour, RN

## 2021-07-02 ENCOUNTER — Encounter (HOSPITAL_BASED_OUTPATIENT_CLINIC_OR_DEPARTMENT_OTHER): Payer: No Typology Code available for payment source | Admitting: Pulmonary Disease

## 2021-07-07 ENCOUNTER — Encounter: Payer: Self-pay | Admitting: Nurse Practitioner

## 2021-07-07 ENCOUNTER — Other Ambulatory Visit: Payer: Self-pay

## 2021-07-07 ENCOUNTER — Ambulatory Visit (INDEPENDENT_AMBULATORY_CARE_PROVIDER_SITE_OTHER): Payer: No Typology Code available for payment source | Admitting: Nurse Practitioner

## 2021-07-07 VITALS — BP 120/82 | HR 92 | Temp 96.9°F | Ht 64.0 in | Wt 358.0 lb

## 2021-07-07 DIAGNOSIS — F332 Major depressive disorder, recurrent severe without psychotic features: Secondary | ICD-10-CM

## 2021-07-07 DIAGNOSIS — F339 Major depressive disorder, recurrent, unspecified: Secondary | ICD-10-CM | POA: Insufficient documentation

## 2021-07-07 MED ORDER — BUPROPION HCL 75 MG PO TABS: 75.0000 mg | ORAL_TABLET | Freq: Two times a day (BID) | ORAL | 5 refills | Status: DC

## 2021-07-07 NOTE — Patient Instructions (Signed)
Start wellbutrin 75mg  with breakfast x 3days, then 1tab with breakfast and supper continuously.  [facility] [facility]: Phone: [phone] [address] [place], [place] Open 24/7, No appointment required.  Suicidal Feelings: How to Help Yourself Suicide is when you end your own life. There are many things you can do to help yourself feel better when struggling with these feelings. Many services and people are available to support you and others who struggle with similar feelings.  If you ever feel like you may hurt yourself or others, or have thoughts about taking your own life, get help right away. To get help: Call your local emergency services (911 in the [place].S[place] The [address] health and human services helpline (211 in the [place] Go to your nearest emergency department. Call a suicide hotline to speak with a trained counselor. The following suicide hotlines are available in the [place]: 1-800-273-TALK 615-101-5473). 1-800-SUICIDE 747-733-1314). 782 080 8258. This is a hotline for Spanish speakers. 402-540-9471. This is a hotline for TTY users. 1-866-4-U-TREVOR (279) 833-3612). This is a hotline for lesbian, gay, bisexual, transgender, or questioning youth. For a list of hotlines in (6-803-212-2482, visit [place].html Contact a crisis center or a local suicide prevention center. To find a crisis center or suicide prevention center: Call your local hospital, clinic, community service organization, mental health center, social service provider, or health department. Ask for help with connecting to a crisis center. For a list of crisis centers in the [employer], visit: suicidepreventionlifeline.org For a list of crisis centers in [place], visit: suicideprevention.ca How to help yourself feel better  Promise yourself that you will not do anything extreme when you have suicidal feelings. Remember the times you  have felt hopeful. Many people have gotten through suicidal thoughts and feelings, and you can too. If you have had these feelings before, remind yourself that you can get through them again. Let family, friends, teachers, or counselors know how you are feeling. Try not to separate yourself from those who care about you and want to help you. Talk with someone every day, even if you do not feel sociable. Face-to-face conversation is best to help them understand your feelings. Contact a mental health care provider and work with this person regularly. Make a safety plan that you can follow during a crisis. Include phone numbers of suicide prevention hotlines, mental health professionals, and trusted friends and family members you can call during an emergency. Save these numbers on your phone. If you are thinking of taking a lot of medicine, give your medicine to someone who can give it to you as prescribed. If you are on antidepressants and are concerned you will overdose, tell your health care provider so that he or she can give you safer medicines. Try to stick to your routines and follow a schedule every day. Make self-care a priority. Make a list of realistic goals, and cross them off when you achieve them. Accomplishments can give you a sense of worth. Wait until you are feeling better before doing things that you find difficult or unpleasant. Do things that you have always enjoyed to take your mind off your feelings. Try reading a book, or listening to or playing music. Spending time outside, in nature, may help you feel better. Follow these instructions at home:  Visit your primary health care provider every year for a checkup. Work with a mental health care provider as needed. Eat a well-balanced diet, and eat regular meals. Get plenty of rest. Exercise if you  are able. Just 30 minutes of exercise each day can help you feel better. Take over-the-counter and prescription medicines only as told by  your health care provider. Ask your mental health care provider about the possible side effects of any medicines you are taking. Do not use alcohol or drugs, and remove these substances from your home. Remove weapons, poisons, knives, and other deadly items from your home. General recommendations Keep your living space well lit. When you are feeling well, write yourself a letter with tips and support that you can read when you are not feeling well. Remember that life's difficulties can be sorted out with help. Conditions can be treated, and you can learn behaviors and ways of thinking that will help you. Where to find more information National Suicide Prevention Lifeline: www.suicidepreventionlifeline.org Hopeline: www.hopeline.com [employer] for Suicide Prevention: https://www.ayers.com/ [employer] (for lesbian, gay, bisexual, transgender, or questioning youth): www.thetrevorproject.[employer] of Mental Health: http://www.wall.info/ Contact a health care provider if: You feel as though you are a burden to others. You feel agitated, angry, vengeful, or have extreme mood swings. You have withdrawn from family and friends. Get help right away if: You are talking about suicide or wishing to die. You start making plans for how to commit suicide. You feel that you have no reason to live. You start making plans for putting your affairs in order, saying goodbye, or giving your possessions away. You feel guilt, shame, or unbearable pain, and it seems like there is no way out. You are frequently using drugs or alcohol. You are engaging in risky behaviors that could lead to death. If you have any of these symptoms, get help right away. Call emergency services, go to your nearest emergency department or crisis center, or call a suicide crisis helpline. Summary Suicide is when you take your own life. Promise yourself that you will not do anything  extreme when you have suicidal feelings. Let family, friends, teachers, or counselors know how you are feeling. Get help right away if you start making plans for how to commit suicide. This information is not intended to replace advice given to you by your health care provider. Make sure you discuss any questions you have with your health care provider. Document Revised: 06/19/2020 Document Reviewed: 06/21/2020 Elsevier Patient Education  2022 Elsevier Inc.  Managing Depression, Adult Depression is a mental health condition that affects your thoughts, feelings, and actions. Being diagnosed with depression can bring you relief if you did not know why you have felt or behaved a certain way. It could also leave you feeling overwhelmed with uncertainty about your future. Preparing yourself to manage your symptoms can help you feel more positive about your future. How to manage lifestyle changes Managing stress Stress is your body's reaction to life changes and events, both good and bad. Stress can add to your feelings of depression. Learning to manage your stress can help lessen your feelings of depression. Try some of the following approaches to reducing your stress (stress reduction techniques): Listen to music that you enjoy and that inspires you. Try using a meditation app or take a meditation class. Develop a practice that helps you connect with your spiritual self. Walk in nature, pray, or go to a place of worship. Do some deep breathing. To do this, inhale slowly through your nose. Pause at the top of your inhale for a few seconds and then exhale slowly, letting your muscles relax. Practice yoga to help relax and work your  muscles. Choose a stress reduction technique that suits your lifestyle and personality. These techniques take time and practice to develop. Set aside 5-15 minutes a day to do them. Therapists can offer training in these techniques. Other things you can do to manage stress  include: Keeping a stress diary. Knowing your limits and saying no when you think something is too much. Paying attention to how you react to certain situations. You may not be able to control everything, but you can change your reaction. Adding humor to your life by watching funny films or TV shows. Making time for activities that you enjoy and that relax you.  Medicines Medicines, such as antidepressants, are often a part of treatment for depression. Talk with your pharmacist or health care provider about all the medicines, supplements, and herbal products that you take, their possible side effects, and what medicines and other products are safe to take together. Make sure to report any side effects you may have to your health care provider. Relationships Your health care provider may suggest family therapy, couples therapy, or individual therapy as part of your treatment. How to recognize changes Everyone responds differently to treatment for depression. As you recover from depression, you may start to: Have more interest in doing activities. Feel less hopeless. Have more energy. Overeat less often, or have a better appetite. Have better mental focus. It is important to recognize if your depression is not getting better or is getting worse. The symptoms you had in the beginning may return, such as: Tiredness (fatigue) or low energy. Eating too much or too little. Sleeping too much or too little. Feeling restless, agitated, or hopeless. Trouble focusing or making decisions. Unexplained physical complaints. Feeling irritable, angry, or aggressive. If you or your family members notice these symptoms coming back, let your health care provider know right away. Follow these instructions at home: Activity  Try to get some form of exercise each day, such as walking, biking, swimming, or lifting weights. Practice stress reduction techniques. Engage your mind by taking a class or doing  some volunteer work. Lifestyle Get the right amount and quality of sleep. Cut down on using caffeine, tobacco, alcohol, and other potentially harmful substances. Eat a healthy diet that includes plenty of vegetables, fruits, whole grains, low-fat dairy products, and lean protein. Do not eat a lot of foods that are high in solid fats, added sugars, or salt (sodium). General instructions Take over-the-counter and prescription medicines only as told by your health care provider. Keep all follow-up visits as told by your health care provider. This is important. Where to find support Talking to others Friends and family members can be sources of support and guidance. Talk to trusted friends or family members about your condition. Explain your symptoms to them, and let them know that you are working with a health care provider to treat your depression. Tell friends and family members how they also can be helpful. Finances Find appropriate mental health providers that fit with your financial situation. Talk with your health care provider about options to get reduced prices on your medicines. Where to find more information You can find support in your area from: Anxiety and Depression Association of America (ADAA): www.adaa.org Mental Health America: www.mentalhealthamerica.net [employer] on Mental Illness: www.nami.org Contact a health care provider if: You stop taking your antidepressant medicines, and you have any of these symptoms: Nausea. Headache. Light-headedness. Chills and body aches. Not being able to sleep (insomnia). You or your friends and family  think your depression is getting worse. Get help right away if: You have thoughts of hurting yourself or others. If you ever feel like you may hurt yourself or others, or have thoughts about taking your own life, get help right away. Go to your nearest emergency department or: Call your local emergency services (911 in the  [place].S[place] Call a suicide crisis helpline, such as the National Suicide Prevention Lifeline at 213-676-4690. This is open 24 hours a day in the U.S. Text the Crisis Text Line at 415-502-2821 (in the U.S.). Summary If you are diagnosed with depression, preparing yourself to manage your symptoms is a good way to feel positive about your future. Work with your health care provider on a management plan that includes stress reduction techniques, medicines (if applicable), therapy, and healthy lifestyle habits. Keep talking with your health care provider about how your treatment is working. If you have thoughts about taking your own life, call a suicide crisis helpline or text a crisis text line. This information is not intended to replace advice given to you by your health care provider. Make sure you discuss any questions you have with your health care provider. Document Revised: 08/16/2019 Document Reviewed: 08/16/2019 Elsevier Patient Education  2022 [employer].
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
## 2021-07-08 ENCOUNTER — Encounter: Payer: Self-pay | Admitting: Pulmonary Disease

## 2021-07-21 ENCOUNTER — Other Ambulatory Visit: Payer: Self-pay

## 2021-07-21 ENCOUNTER — Encounter (HOSPITAL_BASED_OUTPATIENT_CLINIC_OR_DEPARTMENT_OTHER): Payer: Self-pay | Admitting: *Deleted

## 2021-07-21 ENCOUNTER — Emergency Department (HOSPITAL_BASED_OUTPATIENT_CLINIC_OR_DEPARTMENT_OTHER)
Admission: EM | Admit: 2021-07-21 | Discharge: 2021-07-21 | Disposition: A | Discharge status: Home / Self Care | Payer: No Typology Code available for payment source | Attending: Emergency Medicine | Admitting: Emergency Medicine

## 2021-07-21 DIAGNOSIS — E860 Dehydration: Secondary | ICD-10-CM | POA: Insufficient documentation

## 2021-07-21 DIAGNOSIS — J029 Acute pharyngitis, unspecified: Secondary | ICD-10-CM | POA: Diagnosis present

## 2021-07-21 DIAGNOSIS — R590 Localized enlarged lymph nodes: Secondary | ICD-10-CM | POA: Diagnosis not present

## 2021-07-21 DIAGNOSIS — Z20822 Contact with and (suspected) exposure to covid-19: Secondary | ICD-10-CM | POA: Insufficient documentation

## 2021-07-21 DIAGNOSIS — R000 Tachycardia, unspecified: Secondary | ICD-10-CM | POA: Insufficient documentation

## 2021-07-21 LAB — BASIC METABOLIC PANEL
Anion gap: 9 (ref 5–15)
BUN: 6 mg/dL (ref 6–20)
CO2: 22 mmol/L (ref 22–32)
Calcium: 9.2 mg/dL (ref 8.9–10.3)
Chloride: 104 mmol/L (ref 98–111)
Creatinine, Ser: 0.72 mg/dL (ref 0.44–1.00)
GFR, Estimated: >60 mL/min (ref >60)
Glucose, Bld: 119 mg/dL — ABNORMAL HIGH (ref 70–99)
Potassium: 3.7 mmol/L (ref 3.5–5.1)
Sodium: 135 mmol/L (ref 135–145)

## 2021-07-21 LAB — CBC WITH DIFFERENTIAL/PLATELET
Abs Immature Granulocytes: 0.03 10*3/uL (ref 0.00–0.07)
Basophils Absolute: 0 10*3/uL (ref 0.0–0.1)
Basophils Relative: 0 %
Eosinophils Absolute: 0.2 10*3/uL (ref 0.0–0.5)
Eosinophils Relative: 2 %
HCT: 31.6 % — ABNORMAL LOW (ref 36.0–46.0)
Hemoglobin: 10.1 g/dL — ABNORMAL LOW (ref 12.0–15.0)
Immature Granulocytes: 0 %
Lymphocytes Relative: 25 %
Lymphs Abs: 2.4 10*3/uL (ref 0.7–4.0)
MCH: 21.5 pg — ABNORMAL LOW (ref 26.0–34.0)
MCHC: 32 g/dL (ref 30.0–36.0)
MCV: 67.2 fL — ABNORMAL LOW (ref 80.0–100.0)
Monocytes Absolute: 0.6 10*3/uL (ref 0.1–1.0)
Monocytes Relative: 7 %
Neutro Abs: 6.3 10*3/uL (ref 1.7–7.7)
Neutrophils Relative %: 66 %
Platelets: 316 10*3/uL (ref 150–400)
RBC: 4.7 MIL/uL (ref 3.87–5.11)
RDW: 20.6 % — ABNORMAL HIGH (ref 11.5–15.5)
WBC: 9.5 10*3/uL (ref 4.0–10.5)
nRBC: 0 % (ref 0.0–0.2)

## 2021-07-21 LAB — RESP PANEL BY RT-PCR (FLU A&B, COVID) ARPGX2
Influenza A by PCR: NEGATIVE
Influenza B by PCR: NEGATIVE
SARS Coronavirus 2 by RT PCR: NEGATIVE

## 2021-07-21 LAB — GROUP A STREP BY PCR: Group A Strep by PCR: NOT DETECTED

## 2021-07-21 LAB — MONONUCLEOSIS SCREEN: Mono Screen: NEGATIVE

## 2021-07-21 MED ORDER — KETOROLAC TROMETHAMINE 30 MG/ML IJ SOLN
30.0000 mg | Freq: Once | INTRAMUSCULAR | Status: AC
  Administered 2021-07-21: 30 mg via INTRAVENOUS
  Filled 2021-07-21: qty 1

## 2021-07-21 MED ORDER — LIDOCAINE VISCOUS HCL 2 % MT SOLN
15.0000 mL | Freq: Once | OROMUCOSAL | Status: AC
  Administered 2021-07-21: 15 mL via OROMUCOSAL
  Filled 2021-07-21: qty 15

## 2021-07-21 MED ORDER — IBUPROFEN 600 MG PO TABS: 600.0000 mg | ORAL_TABLET | Freq: Four times a day (QID) | ORAL | 0 refills | Status: DC | PRN

## 2021-07-21 MED ORDER — HYDROCODONE-ACETAMINOPHEN 5-325 MG PO TABS
1.0000 | ORAL_TABLET | ORAL | 0 refills | Status: DC | PRN
Start: 2021-07-21 — End: 2021-07-30

## 2021-07-21 MED ORDER — SODIUM CHLORIDE 0.9 % IV BOLUS
1000.0000 mL | Freq: Once | INTRAVENOUS | Status: AC
  Administered 2021-07-21: 1000 mL via INTRAVENOUS

## 2021-07-21 MED ORDER — DEXAMETHASONE SODIUM PHOSPHATE 10 MG/ML IJ SOLN
10.0000 mg | Freq: Once | INTRAMUSCULAR | Status: AC
  Administered 2021-07-21: 10 mg via INTRAVENOUS
  Filled 2021-07-21: qty 1

## 2021-07-21 MED ORDER — AMOXICILLIN 500 MG PO CAPS
500.0000 mg | ORAL_CAPSULE | Freq: Once | ORAL | Status: AC
  Administered 2021-07-21: 500 mg via ORAL
  Filled 2021-07-21: qty 1

## 2021-07-21 MED ORDER — AMOXICILLIN 500 MG PO CAPS: 500.0000 mg | ORAL_CAPSULE | Freq: Three times a day (TID) | ORAL | 0 refills | Status: DC

## 2021-07-21 NOTE — ED Triage Notes (Signed)
Began not feeling well last Tuesday with a sore throat, fatigue and having intermittent low grade fevers, has progressively become worse this past week and now have pain with swallowing and having voice changes.

## 2021-07-21 NOTE — ED Provider Notes (Signed)
[facility] EMERGENCY DEPARTMENT Provider Note   CSN: [id number] Arrival date & time: 07/21/21  6270     History Chief Complaint  Patient presents with   Sore Throat    Michelle Velasquez is a 30 y.o. female.  Pt presents to the ED today with a sore throat.  She said it has been hurting since Tuesday, 9/27.  She has done a few Covid Ag tests which have been negative and did a strep test last week which was negative.  Pt said she has had fevers over the weekend and said it feels like she is swallowing glass.  She is not eating or drinking much.      Past Medical History:  Diagnosis Date   Allergy    Anxiety    Depression    Headache    Neuromuscular disorder (HCC)    PCOS (polycystic ovarian syndrome)     Patient Active Problem List   Diagnosis Date Noted   Severe episode of recurrent major depressive disorder, without psychotic features (HCC) 07/07/2021   Multiple sclerosis (HCC) 12/27/2020   Migraine with aura and without status migrainosus, not intractable 05/16/2020   BMI 60.0-69.9, adult (HCC) 05/16/2020   Dysmenorrhea 05/16/2020   Menorrhagia with irregular cycle 05/16/2020    History reviewed. No pertinent surgical history.   OB History     Gravida  0   Para  0   Term  0   Preterm  0   AB  0   Living  0      SAB  0   IAB  0   Ectopic  0   Multiple  0   Live Births  0           Family History  Problem Relation Age of Onset   Diabetes Father    Multiple sclerosis Mother    Fibroids Mother    Fibromyalgia Mother     Social History   Tobacco Use   Smoking status: Never   Smokeless tobacco: Never  Vaping Use   Vaping Use: Never used  Substance Use Topics   Alcohol use: Never   Drug use: Never    Home Medications Prior to Admission medications   Medication Sig Start Date End Date Taking? Authorizing Provider  amoxicillin (AMOXIL) 500 MG capsule Take 1 capsule (500 mg total) by mouth 3 (three) times daily. 07/21/21   Yes Jacalyn Lefevre, MD  HYDROcodone-acetaminophen (NORCO/VICODIN) 5-325 MG tablet Take 1 tablet by mouth every 4 (four) hours as needed. 07/21/21  Yes Jacalyn Lefevre, MD  ibuprofen (ADVIL) 600 MG tablet Take 1 tablet (600 mg total) by mouth every 6 (six) hours as needed. 07/21/21  Yes Jacalyn Lefevre, MD  buPROPion (WELLBUTRIN) 75 MG tablet Take 1 tablet (75 mg total) by mouth 2 (two) times daily. With food 07/07/21   Nche, Bonna Gains, NP  Cholecalciferol (D3-50) 1.25 MG (50000 UT) capsule Take 1 capsule (50,000 Units total) by mouth every 7 (seven) days. 02/12/21   Jaffe, Adam R, DO  gabapentin (NEURONTIN) 100 MG capsule TAKE 1 CAPSULE BY MOUTH THREE TIMES DAILY FOR 1 WEEK THEN TAKE 2 CAPSULES THREE TIMES DAILY FOR 1 WEEK THEN TAKE 3 CAPSULES THREE TIMES DAILY 06/09/21   Shon Millet R, DO  Natalizumab (TYSABRI IV) Inject into the vein.    [provider]  ondansetron (ZOFRAN) 4 MG tablet Take 1-2 tablets (4-8 mg total) by mouth every 8 (eight) hours as needed. 10/24/20   Drema Dallas,  DO  ramelteon (ROZEREM) 8 MG tablet Take 1 tablet (8 mg total) by mouth at bedtime. 02/26/21   Olalere, Minna Antis, MD  rizatriptan (MAXALT-MLT) 10 MG disintegrating tablet Take 1 tablet earliest onset of migraine.  May repeat in 2 hours if needed.  Maximum 2 tablets in 24 hours. 10/24/20   Drema Dallas, DO  topiramate (TOPAMAX) 50 MG tablet Take 1 tablet (50 mg total) by mouth at bedtime. 10/24/20   Drema Dallas, DO    Allergies    Sulfa antibiotics, Gadolinium derivatives, and Grass pollen(k-o-[id number]-t-swt vern)  Review of Systems   Review of Systems  Constitutional:  Positive for fever.  HENT:  Positive for sore throat and trouble swallowing.   All other systems reviewed and are negative.  Physical Exam Updated Vital Signs BP (!) 129/94   Pulse 94   Temp 98.8 F (37.1 C) (Oral)   Resp 20   Ht 5\' 3"  (1.6 m)   Wt (!) 159.9 kg   SpO2 100%   BMI 62.46 kg/m   Physical Exam Vitals and nursing note  reviewed.  Constitutional:      Appearance: She is well-developed. She is obese.  HENT:     Head: Normocephalic and atraumatic.     Mouth/Throat:     Mouth: Mucous membranes are dry.     Pharynx: Pharyngeal swelling, oropharyngeal exudate and posterior oropharyngeal erythema present.  Eyes:     Conjunctiva/sclera: Conjunctivae normal.     Pupils: Pupils are equal, round, and reactive to light.  Cardiovascular:     Rate and Rhythm: Regular rhythm. Tachycardia present.     Heart sounds: Normal heart sounds.  Pulmonary:     Effort: Pulmonary effort is normal.     Breath sounds: Normal breath sounds.  Abdominal:     General: Bowel sounds are normal.     Palpations: Abdomen is soft.  Musculoskeletal:     Cervical back: Normal range of motion and neck supple.  Lymphadenopathy:     Cervical: Cervical adenopathy present.  Skin:    General: Skin is warm and dry.     Capillary Refill: Capillary refill takes less than 2 seconds.  Neurological:     General: No focal deficit present.     Mental Status: She is alert and oriented to person, place, and time.  Psychiatric:        Mood and Affect: Mood normal.        Behavior: Behavior normal.    ED Results / Procedures / Treatments   Labs (all labs ordered are listed, but only abnormal results are displayed) Labs Reviewed  CBC WITH DIFFERENTIAL/PLATELET - Abnormal; Notable for the following components:      Result Value   Hemoglobin 10.1 (*)    HCT 31.6 (*)    MCV 67.2 (*)    MCH 21.5 (*)    RDW 20.6 (*)    All other components within normal limits  BASIC METABOLIC PANEL - Abnormal; Notable for the following components:   Glucose, Bld 119 (*)    All other components within normal limits  RESP PANEL BY RT-PCR (FLU A&B, COVID) ARPGX2  GROUP A STREP BY PCR  MONONUCLEOSIS SCREEN    EKG None  Radiology No results found.  Procedures Procedures   Medications Ordered in ED Medications  amoxicillin (AMOXIL) capsule 500 mg (has  no administration in time range)  sodium chloride 0.9 % bolus 1,000 mL (0 mLs Intravenous Stopped 07/21/21 0910)  ketorolac (TORADOL) 30  MG/ML injection 30 mg (30 mg Intravenous Given 07/21/21 0809)  dexamethasone (DECADRON) injection 10 mg (10 mg Intravenous Given 07/21/21 0809)  lidocaine (XYLOCAINE) 2 % viscous mouth solution 15 mL (15 mLs Mouth/Throat Given 07/21/21 0809)    ED Course  I have reviewed the triage vital signs and the nursing notes.  Pertinent labs & imaging results that were available during my care of the patient were reviewed by me and considered in my medical decision making (see chart for details).    MDM Rules/Calculators/A&P                           Strep, Covid, Flu, Mono all neg.  Labs unremarkable for anything acute.  HR better after IVFs.  Pt's throat is erythematous and has a lot of exudates.  She will be started on amox.  She is to return if worse.  F/u with pcp.  Brookelynne Bednarczyk was evaluated in Emergency Department on 07/21/2021 for the symptoms described in the history of present illness. She was evaluated in the context of the global COVID-19 pandemic, which necessitated consideration that the patient might be at risk for infection with the SARS-CoV-2 virus that causes COVID-19. Institutional protocols and algorithms that pertain to the evaluation of patients at risk for COVID-19 are in a state of rapid change based on information released by regulatory bodies including the CDC and federal and state organizations. These policies and algorithms were followed during the patient's care in the ED.  Final Clinical Impression(s) / ED Diagnoses Final diagnoses:  Pharyngitis, unspecified etiology  Dehydration    Rx / DC Orders ED Discharge Orders          Ordered    amoxicillin (AMOXIL) 500 MG capsule  3 times daily        07/21/21 0927    ibuprofen (ADVIL) 600 MG tablet  Every 6 hours PRN        07/21/21 0927    HYDROcodone-acetaminophen (NORCO/VICODIN)  5-325 MG tablet  Every 4 hours PRN        07/21/21 8527             Jacalyn Lefevre, MD 07/21/21 [phone]

## 2021-07-21 NOTE — ED Notes (Signed)
ED Provider at bedside. 

## 2021-07-24 ENCOUNTER — Other Ambulatory Visit: Payer: Self-pay

## 2021-07-24 ENCOUNTER — Ambulatory Visit (INDEPENDENT_AMBULATORY_CARE_PROVIDER_SITE_OTHER): Payer: No Typology Code available for payment source

## 2021-07-24 VITALS — BP 139/95 | HR 80 | Temp 98.0°F | Resp 18 | Ht 64.0 in | Wt 352.8 lb

## 2021-07-24 DIAGNOSIS — G35 Multiple sclerosis: Secondary | ICD-10-CM

## 2021-07-24 MED ORDER — SODIUM CHLORIDE 0.9% FLUSH: 3.0000 mL | Freq: Once | INTRAVENOUS | Status: DC | PRN

## 2021-07-24 MED ORDER — SODIUM CHLORIDE 0.9 % IV SOLN: INTRAVENOUS | Status: DC

## 2021-07-24 MED ORDER — ACETAMINOPHEN 325 MG PO TABS
650.0000 mg | ORAL_TABLET | Freq: Once | ORAL | Status: AC
  Administered 2021-07-24: 650 mg via ORAL
  Filled 2021-07-24: qty 2

## 2021-07-24 MED ORDER — EPINEPHRINE 0.3 MG/0.3ML IJ SOAJ: 0.3000 mg | Freq: Once | INTRAMUSCULAR | Status: DC | PRN

## 2021-07-24 MED ORDER — METHYLPREDNISOLONE SODIUM SUCC 125 MG IJ SOLR: 125.0000 mg | Freq: Once | INTRAMUSCULAR | Status: DC | PRN

## 2021-07-24 MED ORDER — HEPARIN SOD (PORK) LOCK FLUSH 100 UNIT/ML IV SOLN: 250.0000 [IU] | Freq: Once | INTRAVENOUS | Status: DC | PRN

## 2021-07-24 MED ORDER — ALTEPLASE 2 MG IJ SOLR: 2.0000 mg | Freq: Once | INTRAMUSCULAR | Status: DC | PRN

## 2021-07-24 MED ORDER — SODIUM CHLORIDE 0.9 % IV SOLN
300.0000 mg | Freq: Once | INTRAVENOUS | Status: AC
  Administered 2021-07-24: 300 mg via INTRAVENOUS
  Filled 2021-07-24: qty 15

## 2021-07-24 MED ORDER — SODIUM CHLORIDE 0.9 % IV SOLN: Freq: Once | INTRAVENOUS | Status: DC | PRN

## 2021-07-24 MED ORDER — FAMOTIDINE IN NACL 20-0.9 MG/50ML-% IV SOLN: 20.0000 mg | Freq: Once | INTRAVENOUS | Status: DC | PRN

## 2021-07-24 MED ORDER — SODIUM CHLORIDE 0.9% FLUSH: 10.0000 mL | Freq: Once | INTRAVENOUS | Status: DC | PRN

## 2021-07-24 MED ORDER — ANTICOAGULANT SODIUM CITRATE 4% (200MG/5ML) IV SOLN
5.0000 mL | Freq: Once | Status: DC | PRN
  Filled 2021-07-24: qty 5

## 2021-07-24 MED ORDER — HEPARIN SOD (PORK) LOCK FLUSH 100 UNIT/ML IV SOLN: 500.0000 [IU] | Freq: Once | INTRAVENOUS | Status: DC | PRN

## 2021-07-24 MED ORDER — LORATADINE 10 MG PO TABS
10.0000 mg | ORAL_TABLET | Freq: Once | ORAL | Status: AC
  Administered 2021-07-24: 10 mg via ORAL
  Filled 2021-07-24: qty 1

## 2021-07-24 MED ORDER — DIPHENHYDRAMINE HCL 50 MG/ML IJ SOLN: 50.0000 mg | Freq: Once | INTRAMUSCULAR | Status: DC | PRN

## 2021-07-24 MED ORDER — ALBUTEROL SULFATE HFA 108 (90 BASE) MCG/ACT IN AERS: 2.0000 | INHALATION_SPRAY | Freq: Once | RESPIRATORY_TRACT | Status: DC | PRN

## 2021-07-24 NOTE — Progress Notes (Signed)
   Performed by:  Marguarite Arbour, RN    Diagnosis: Multiple Sclerosis  Provider:  Chilton Greathouse, MD  Procedure: Infusion  IV Type: Peripheral, IV Location: L Antecubital  Tysabri (Natalizumab), Dose: 300 mg  Infusion Start Time: 1016  Infusion Stop Time: 1122  Post Infusion IV Care: Observation period completed and Peripheral IV Discontinued  Discharge: Condition: Good, Destination: Home . AVS provided to patient.   Performed by:  Marguarite Arbour, RN

## 2021-07-30 ENCOUNTER — Encounter: Payer: Self-pay | Admitting: Nurse Practitioner

## 2021-07-30 ENCOUNTER — Other Ambulatory Visit: Payer: Self-pay

## 2021-07-30 ENCOUNTER — Ambulatory Visit (INDEPENDENT_AMBULATORY_CARE_PROVIDER_SITE_OTHER): Payer: No Typology Code available for payment source | Admitting: Nurse Practitioner

## 2021-07-30 VITALS — BP 134/90 | HR 98 | Temp 96.7°F | Wt 357.4 lb

## 2021-07-30 DIAGNOSIS — F332 Major depressive disorder, recurrent severe without psychotic features: Secondary | ICD-10-CM | POA: Diagnosis not present

## 2021-07-30 NOTE — Progress Notes (Signed)
Subjective:  Patient ID: Michelle Velasquez, female    DOB: July 30, 1991  Age: 30 y.o. MRN: [id number]  CC: Follow-up (F/u on anxiety and depression. )  HPI  Severe episode of recurrent major depressive disorder, without psychotic features (HCC) Reports improved mood, but unable to tolerate Wellbutrin. Caused strange dreams and worsening depression. She d/c med after 1week. She opted not to take another med at this time. She plans to schedule appt with therapist. No SI/HI/hallucination or delusional thoughts. F/up in 31months  Reviewed past Medical, Social and Family history today.  Outpatient Medications Prior to Visit  Medication Sig Dispense Refill   Cholecalciferol (D3-50) 1.25 MG (50000 UT) capsule Take 1 capsule (50,000 Units total) by mouth every 7 (seven) days. 4 capsule 5   gabapentin (NEURONTIN) 100 MG capsule TAKE 1 CAPSULE BY MOUTH THREE TIMES DAILY FOR 1 WEEK THEN TAKE 2 CAPSULES THREE TIMES DAILY FOR 1 WEEK THEN TAKE 3 CAPSULES THREE TIMES DAILY 180 capsule 0   Natalizumab (TYSABRI IV) Inject into the vein.     ondansetron (ZOFRAN) 4 MG tablet Take 1-2 tablets (4-8 mg total) by mouth every 8 (eight) hours as needed. 20 tablet 5   ramelteon (ROZEREM) 8 MG tablet Take 1 tablet (8 mg total) by mouth at bedtime. 30 tablet 3   rizatriptan (MAXALT-MLT) 10 MG disintegrating tablet Take 1 tablet earliest onset of migraine.  May repeat in 2 hours if needed.  Maximum 2 tablets in 24 hours. 10 tablet 5   topiramate (TOPAMAX) 50 MG tablet Take 1 tablet (50 mg total) by mouth at bedtime. 30 tablet 5   buPROPion (WELLBUTRIN) 75 MG tablet Take 1 tablet (75 mg total) by mouth 2 (two) times daily. With food 60 tablet 5   amoxicillin (AMOXIL) 500 MG capsule Take 1 capsule (500 mg total) by mouth 3 (three) times daily. 21 capsule 0   HYDROcodone-acetaminophen (NORCO/VICODIN) 5-325 MG tablet Take 1 tablet by mouth every 4 (four) hours as needed. 10 tablet 0   ibuprofen (ADVIL) 600 MG tablet Take 1  tablet (600 mg total) by mouth every 6 (six) hours as needed. 30 tablet 0   No facility-administered medications prior to visit.   ROS See HPI  Objective:  BP 134/90 (BP Location: Left Arm, Patient Position: Sitting, Cuff Size: Large)   Pulse 98   Temp (!) 96.7 F (35.9 C) (Temporal)   Wt (!) 357 lb 6.4 oz (162.1 kg)   SpO2 98%   BMI 61.35 kg/m   Physical Exam Cardiovascular:     Rate and Rhythm: Normal rate.     Pulses: Normal pulses.  Pulmonary:     Effort: Pulmonary effort is normal.  Neurological:     Mental Status: She is alert and oriented to person, place, and time.  Psychiatric:        Mood and Affect: Mood normal.        Behavior: Behavior normal.        Thought Content: Thought content normal.   Assessment & Plan:  This visit occurred during the SARS-CoV-2 public health emergency.  Safety protocols were in place, including screening questions prior to the visit, additional usage of staff PPE, and extensive cleaning of exam room while observing appropriate contact time as indicated for disinfecting solutions.   Michelle Velasquez was seen today for follow-up.  Diagnoses and all orders for this visit:  Severe episode of recurrent major depressive disorder, without psychotic features (HCC)   Problem List Items Addressed This Visit  Other   Severe episode of recurrent major depressive disorder, without psychotic features (HCC) - Primary    Reports improved mood, but unable to tolerate Wellbutrin. Caused strange dreams and worsening depression. She d/c med after 1week. She opted not to take another med at this time. She plans to schedule appt with therapist. No SI/HI/hallucination or delusional thoughts. F/up in 67months       Follow-up: Return in about 3 months (around 10/30/2021) for CPE (fasting) and anxiety f/up.  Michelle Penna, NP

## 2021-07-30 NOTE — Assessment & Plan Note (Signed)
Reports improved mood, but unable to tolerate Wellbutrin. Caused strange dreams and worsening depression. She d/c med after 1week. She opted not to take another med at this time. She plans to schedule appt with therapist. No SI/HI/hallucination or delusional thoughts. F/up in 51months
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
## 2021-08-04 ENCOUNTER — Other Ambulatory Visit (INDEPENDENT_AMBULATORY_CARE_PROVIDER_SITE_OTHER): Payer: No Typology Code available for payment source

## 2021-08-04 ENCOUNTER — Other Ambulatory Visit: Payer: Self-pay

## 2021-08-04 ENCOUNTER — Ambulatory Visit (INDEPENDENT_AMBULATORY_CARE_PROVIDER_SITE_OTHER): Payer: No Typology Code available for payment source | Admitting: Neurology

## 2021-08-04 ENCOUNTER — Encounter: Payer: Self-pay | Admitting: Neurology

## 2021-08-04 VITALS — BP 143/87 | HR 83 | Ht 64.0 in | Wt 355.8 lb

## 2021-08-04 DIAGNOSIS — G35 Multiple sclerosis: Secondary | ICD-10-CM

## 2021-08-04 DIAGNOSIS — G43109 Migraine with aura, not intractable, without status migrainosus: Secondary | ICD-10-CM | POA: Diagnosis not present

## 2021-08-04 LAB — CBC WITH DIFFERENTIAL/PLATELET
Basophils Absolute: 0 10*3/uL (ref 0.0–0.1)
Basophils Relative: 0.2 % (ref 0.0–3.0)
Eosinophils Absolute: 0.3 10*3/uL (ref 0.0–0.7)
Eosinophils Relative: 3 % (ref 0.0–5.0)
HCT: 29.9 % — ABNORMAL LOW (ref 36.0–46.0)
Hemoglobin: 9.6 g/dL — ABNORMAL LOW (ref 12.0–15.0)
Lymphocytes Relative: 49.2 % — ABNORMAL HIGH (ref 12.0–46.0)
Lymphs Abs: 5 10*3/uL — ABNORMAL HIGH (ref 0.7–4.0)
MCHC: 32.2 g/dL (ref 30.0–36.0)
MCV: 66.6 fl — ABNORMAL LOW (ref 78.0–100.0)
Monocytes Absolute: 0.5 10*3/uL (ref 0.1–1.0)
Monocytes Relative: 4.9 % (ref 3.0–12.0)
Neutro Abs: 4.4 10*3/uL (ref 1.4–7.7)
Neutrophils Relative %: 42.7 % — ABNORMAL LOW (ref 43.0–77.0)
Platelets: 327 10*3/uL (ref 150.0–400.0)
RBC: 4.48 Mil/uL (ref 3.87–5.11)
RDW: 21.3 % — ABNORMAL HIGH (ref 11.5–15.5)
WBC: 10.2 10*3/uL (ref 4.0–10.5)

## 2021-08-04 LAB — HEPATIC FUNCTION PANEL
ALT: 24 U/L (ref 0–35)
AST: 21 U/L (ref 0–37)
Albumin: 3.9 g/dL (ref 3.5–5.2)
Alkaline Phosphatase: 83 U/L (ref 39–117)
Bilirubin, Direct: 0.1 mg/dL (ref 0.0–0.3)
Total Bilirubin: 0.5 mg/dL (ref 0.2–1.2)
Total Protein: 7.7 g/dL (ref 6.0–8.3)

## 2021-08-04 LAB — VITAMIN D 25 HYDROXY (VIT D DEFICIENCY, FRACTURES): VITD: 26.88 ng/mL — ABNORMAL LOW (ref 30.00–100.00)

## 2021-08-04 MED ORDER — VENLAFAXINE HCL ER 37.5 MG PO CP24: ORAL_CAPSULE | ORAL | 0 refills | Status: DC

## 2021-08-04 MED ORDER — GABAPENTIN 300 MG PO CAPS: 300.0000 mg | ORAL_CAPSULE | Freq: Three times a day (TID) | ORAL | 5 refills | Status: DC

## 2021-08-04 NOTE — Patient Instructions (Signed)
Stop topiramate.  Start venlafaxine 1 pill daily for one week, then increase 2 pills daily Continue gabapentin 300mg  three times daily Continue D3 50,000 IU daily Continue rizatriptan as needed Check CBC with diff, hepatic panel, JC antibody with index and D level now and again in 6 months Check MRI brain/cervical/thoracic with and without contrast Follow up 6  months in 6 months or sooner if needed

## 2021-08-13 LAB — RFLX STRATIFY JCV (TM) AB INHIBITION: JCV Antibody by Inhibition: NEGATIVE

## 2021-08-13 LAB — STRATIFY JCV AB (W/ INDEX) W/ RFLX
Index Value: 0.23 — ABNORMAL HIGH
Stratify JCV (TM) Ab w/Reflex Inhibition: UNDETERMINED — AB

## 2021-08-21 ENCOUNTER — Other Ambulatory Visit: Payer: Self-pay

## 2021-08-21 ENCOUNTER — Ambulatory Visit (INDEPENDENT_AMBULATORY_CARE_PROVIDER_SITE_OTHER): Payer: No Typology Code available for payment source

## 2021-08-21 VITALS — BP 147/95 | HR 80 | Temp 98.2°F | Resp 18 | Ht 64.0 in | Wt 356.6 lb

## 2021-08-21 DIAGNOSIS — G35 Multiple sclerosis: Secondary | ICD-10-CM | POA: Diagnosis not present

## 2021-08-21 MED ORDER — SODIUM CHLORIDE 0.9 % IV SOLN
300.0000 mg | Freq: Once | INTRAVENOUS | Status: AC
  Administered 2021-08-21: 300 mg via INTRAVENOUS
  Filled 2021-08-21: qty 15

## 2021-08-21 MED ORDER — LORATADINE 10 MG PO TABS
10.0000 mg | ORAL_TABLET | Freq: Once | ORAL | Status: AC
  Administered 2021-08-21: 10 mg via ORAL
  Filled 2021-08-21: qty 1

## 2021-08-21 MED ORDER — ACETAMINOPHEN 325 MG PO TABS
650.0000 mg | ORAL_TABLET | Freq: Once | ORAL | Status: AC
  Administered 2021-08-21: 650 mg via ORAL
  Filled 2021-08-21: qty 2

## 2021-08-21 MED ORDER — SODIUM CHLORIDE 0.9 % IV SOLN: INTRAVENOUS | Status: DC

## 2021-08-21 NOTE — Progress Notes (Signed)
Diagnosis: Multiple Sclerosis  Provider:  Chilton Greathouse, MD  Procedure: Infusion  IV Type: Peripheral, IV Location: L Forearm  Tysabri (Natalizumab), Dose: 300 mg  Infusion Start Time: 1045  Infusion Stop Time: 1145  Post Infusion IV Care: Observation period completed and Peripheral IV Discontinued  Discharge: Condition: Good, Destination: Home . AVS provided to patient.   Performed by:  Marguarite Arbour, RN

## 2021-08-22 NOTE — Progress Notes (Signed)
LMOVM to call the office back in regards to her Labs results.

## 2021-09-01 ENCOUNTER — Encounter: Payer: Self-pay | Admitting: Neurology

## 2021-09-11 ENCOUNTER — Other Ambulatory Visit: Payer: Self-pay | Admitting: Neurology

## 2021-09-15 ENCOUNTER — Encounter: Payer: Self-pay | Admitting: Nurse Practitioner

## 2021-09-18 ENCOUNTER — Ambulatory Visit (INDEPENDENT_AMBULATORY_CARE_PROVIDER_SITE_OTHER): Payer: No Typology Code available for payment source

## 2021-09-18 ENCOUNTER — Encounter: Payer: Self-pay | Admitting: Neurology

## 2021-09-18 ENCOUNTER — Other Ambulatory Visit: Payer: Self-pay

## 2021-09-18 VITALS — BP 162/84 | HR 79 | Temp 98.4°F | Resp 20 | Wt 366.4 lb

## 2021-09-18 DIAGNOSIS — G35 Multiple sclerosis: Secondary | ICD-10-CM

## 2021-09-18 MED ORDER — LORATADINE 10 MG PO TABS
10.0000 mg | ORAL_TABLET | Freq: Once | ORAL | Status: AC
  Administered 2021-09-18: 10 mg via ORAL
  Filled 2021-09-18: qty 1

## 2021-09-18 MED ORDER — EPINEPHRINE 0.3 MG/0.3ML IJ SOAJ: 0.3000 mg | Freq: Once | INTRAMUSCULAR | Status: DC | PRN

## 2021-09-18 MED ORDER — DIPHENHYDRAMINE HCL 50 MG/ML IJ SOLN: 50.0000 mg | Freq: Once | INTRAMUSCULAR | Status: DC | PRN

## 2021-09-18 MED ORDER — FAMOTIDINE IN NACL 20-0.9 MG/50ML-% IV SOLN: 20.0000 mg | Freq: Once | INTRAVENOUS | Status: DC | PRN

## 2021-09-18 MED ORDER — SODIUM CHLORIDE 0.9 % IV SOLN: Freq: Once | INTRAVENOUS | Status: DC | PRN

## 2021-09-18 MED ORDER — METHYLPREDNISOLONE SODIUM SUCC 125 MG IJ SOLR: 125.0000 mg | Freq: Once | INTRAMUSCULAR | Status: DC | PRN

## 2021-09-18 MED ORDER — ALBUTEROL SULFATE HFA 108 (90 BASE) MCG/ACT IN AERS: 2.0000 | INHALATION_SPRAY | Freq: Once | RESPIRATORY_TRACT | Status: DC | PRN

## 2021-09-18 MED ORDER — SODIUM CHLORIDE 0.9 % IV SOLN
300.0000 mg | Freq: Once | INTRAVENOUS | Status: AC
  Administered 2021-09-18: 300 mg via INTRAVENOUS
  Filled 2021-09-18: qty 15

## 2021-09-18 MED ORDER — SODIUM CHLORIDE 0.9 % IV SOLN: INTRAVENOUS | Status: DC

## 2021-09-18 MED ORDER — ACETAMINOPHEN 325 MG PO TABS
650.0000 mg | ORAL_TABLET | Freq: Once | ORAL | Status: AC
  Administered 2021-09-18: 650 mg via ORAL
  Filled 2021-09-18: qty 2

## 2021-09-18 NOTE — Progress Notes (Signed)
Diagnosis: Multiple Sclerosis  Provider:  Chilton Greathouse, MD  Procedure: Infusion  IV Type: Peripheral, IV Location: L Antecubital  Tysabri (Natalizumab), Dose: 300 mg  Infusion Start Time: 1035  Infusion Stop Time: 1135  Post Infusion IV Care: Observation period completed  Discharge: Condition: Good, Destination: Home . AVS provided to patient.   Performed by:  Marilynn Rail, RN

## 2021-09-19 ENCOUNTER — Encounter: Payer: Self-pay | Admitting: Neurology

## 2021-09-22 NOTE — Progress Notes (Signed)
Diagnosis: Multiple Sclerosis  Provider:  Chilton Greathouse, MD  Procedure: Infusion  IV Type: Peripheral, IV Location: L Antecubital  Tysabri (Natalizumab), Dose: 300 mg  Infusion Start Time: 1035  Infusion Stop Time: 1135  Post Infusion IV Care: Observation period completed  Discharge: Condition: Good, Destination: Home . AVS provided to patient.   Performed by:  Asher Muir RN

## 2021-09-29 ENCOUNTER — Telehealth: Payer: Self-pay

## 2021-09-29 ENCOUNTER — Telehealth: Payer: Self-pay | Admitting: Pharmacy Technician

## 2021-09-29 ENCOUNTER — Encounter: Payer: Self-pay | Admitting: Neurology

## 2021-09-29 NOTE — Telephone Encounter (Addendum)
Fyi notes:  [facility] ON-LINE PROGRAM RENEWAL  Tysabri Touch On-Line Re-authorization questionare completed: Exp: 04/03/22. ID# [id number] Re-auth questionare must be completed by MD every 6 months for patient to remain active.  Insurance: APPROVED [employer] Auth date: 03/18/21 - 03/18/22

## 2021-09-29 NOTE — Telephone Encounter (Signed)
New message   Received fax from Touch prescribing program   Notice of patient authorization   Valid from 11.30.2022 to 6.16.2023

## 2021-10-01 ENCOUNTER — Other Ambulatory Visit: Payer: Self-pay | Admitting: Neurology

## 2021-10-01 ENCOUNTER — Encounter: Payer: Self-pay | Admitting: Neurology

## 2021-10-03 ENCOUNTER — Other Ambulatory Visit: Payer: Self-pay | Admitting: Neurology

## 2021-10-03 MED ORDER — VENLAFAXINE HCL ER 75 MG PO CP24: 75.0000 mg | ORAL_CAPSULE | Freq: Every day | ORAL | 5 refills | Status: DC

## 2021-10-06 ENCOUNTER — Encounter: Payer: Self-pay | Admitting: Neurology

## 2021-10-17 ENCOUNTER — Encounter: Payer: Self-pay | Admitting: Neurology

## 2021-10-17 ENCOUNTER — Other Ambulatory Visit: Payer: Self-pay

## 2021-10-17 MED ORDER — AIMOVIG 140 MG/ML ~~LOC~~ SOAJ: 140.0000 mg | SUBCUTANEOUS | 3 refills | Status: DC

## 2021-10-23 ENCOUNTER — Ambulatory Visit (INDEPENDENT_AMBULATORY_CARE_PROVIDER_SITE_OTHER): Payer: 59

## 2021-10-23 ENCOUNTER — Other Ambulatory Visit: Payer: Self-pay

## 2021-10-23 VITALS — BP 166/80 | HR 79 | Temp 98.3°F | Resp 20 | Ht 64.0 in | Wt 366.0 lb

## 2021-10-23 DIAGNOSIS — G35 Multiple sclerosis: Secondary | ICD-10-CM

## 2021-10-23 MED ORDER — SODIUM CHLORIDE 0.9 % IV SOLN: INTRAVENOUS | Status: DC

## 2021-10-23 MED ORDER — SODIUM CHLORIDE 0.9 % IV SOLN
300.0000 mg | Freq: Once | INTRAVENOUS | Status: AC
  Administered 2021-10-23: 300 mg via INTRAVENOUS
  Filled 2021-10-23: qty 15

## 2021-10-23 MED ORDER — LORATADINE 10 MG PO TABS
10.0000 mg | ORAL_TABLET | Freq: Once | ORAL | Status: AC
  Administered 2021-10-23: 10 mg via ORAL
  Filled 2021-10-23: qty 1

## 2021-10-23 MED ORDER — ACETAMINOPHEN 325 MG PO TABS
650.0000 mg | ORAL_TABLET | Freq: Once | ORAL | Status: AC
  Administered 2021-10-23: 650 mg via ORAL
  Filled 2021-10-23: qty 2

## 2021-10-23 NOTE — Progress Notes (Signed)
Diagnosis: Multiple Sclerosis  Provider:  Chilton Greathouse, MD  Procedure: Infusion  IV Type: Peripheral, IV Location: L Antecubital  Tysabri (Natalizumab), Dose: 300 mg  Infusion Start Time: 0945  Infusion Stop Time: 1053  Post Infusion IV Care: Observation period completed and Peripheral IV Discontinued  Discharge: Condition: Good, Destination: Home . AVS provided to patient.   Performed by:  Nat Math, RN

## 2021-10-23 NOTE — Patient Instructions (Signed)
Excuse from Work, School, or Physical Activity ?_______________________________________________________ needs to be excused from: ?____ Work. ?____ School. ?____ Physical activity. ?This is effective for the following dates: ______________________________________. ?He or she may return to work or school, but should avoid physical activity or other activities from now until _______________. ?Activity restrictions include: ?____ Lifting more than _________ lb. ?____ Sitting longer than __________ minutes at a time. ?____ Standing longer than ________ minutes at a time. ?____ Other activities including: ___________________________________________________________________________________ ?____ He or she may return to full physical activity on ________________. ?Health care provider name (printed): _____________________________________________________ ?Health care provider (signature): _________________________________________________________ ?Date: ______________________________ ?This information is not intended to replace advice given to you by your health care provider. Make sure you discuss any questions you have with your health care provider. ?Document Revised: 03/27/2021 Document Reviewed: 09/25/2020 ?Elsevier Patient Education ? 2022 Elsevier Inc. ? ?

## 2021-10-24 ENCOUNTER — Other Ambulatory Visit: Payer: Self-pay | Admitting: Neurology

## 2021-10-28 ENCOUNTER — Other Ambulatory Visit: Payer: Self-pay

## 2021-10-28 MED ORDER — EMGALITY 120 MG/ML ~~LOC~~ SOAJ
120.0000 mg | SUBCUTANEOUS | 5 refills | Status: DC
Start: 2021-10-28 — End: 2022-02-17

## 2021-10-28 MED ORDER — EMGALITY 120 MG/ML ~~LOC~~ SOAJ: 240.0000 mg | Freq: Once | SUBCUTANEOUS | 0 refills | Status: AC

## 2021-10-28 NOTE — Progress Notes (Signed)
Per Dr.Jaffe, Alternatively I would try Emgality - first dose requires 2 injections and then just 1 injection every 28 days.  We will need a prior authorization.  If you would like to try that we will have to check your insurance to see if it will covered.

## 2021-10-30 ENCOUNTER — Telehealth: Payer: Self-pay

## 2021-10-30 NOTE — Telephone Encounter (Signed)
New message   Your information has been sent to [facility].  Michelle Velasquez (Key: [id number]) Emgality 120MG /ML auto-injectors (migraine)   Form [facility] Electronic Prior Authorization Form (2017 NCPDP) Created 24 minutes ago Sent to Plan 21 minutes ago Plan Response 20 minutes ago Submit Clinical Questions less than a minute ago Determination Wait for Determination Please wait for [facility] 2017 NCPDP to return a determination.

## 2021-10-30 NOTE — Telephone Encounter (Signed)
New message   Your information has been sent to [facility]...  Michelle Velasquez (Key: [id number]) Aimovig 140MG /ML auto-injectors   Form [facility] Electronic Prior Authorization Form (2017 NCPDP) Created 7 minutes ago Sent to Plan 6 minutes ago Plan Response 5 minutes ago Submit Clinical Questions less than a minute ago Determination Wait for Determination Please wait for [facility] 2017 NCPDP to return a determination.

## 2021-10-30 NOTE — Telephone Encounter (Signed)
F/u  Michelle Velasquez (Key: [id number]) Emgality 120MG /ML auto-injectors (migraine)   Form [facility] Electronic Prior Authorization Form (2017 NCPDP) Created 24 minutes ago Sent to Plan 21 minutes ago Plan Response 20 minutes ago Submit Clinical Questions less than a minute ago Determination Wait for Determination Please wait for [facility] 2017 NCPDP to return a determination.

## 2021-10-31 ENCOUNTER — Encounter: Payer: Self-pay | Admitting: Neurology

## 2021-11-12 ENCOUNTER — Encounter: Payer: Self-pay | Admitting: Neurology

## 2021-11-12 NOTE — Telephone Encounter (Signed)
F/u   Received denial for both medication   Emaglity & Aimovig   Appeal process started today additional information fax to  [phone]

## 2021-11-12 NOTE — Telephone Encounter (Signed)
Michelle Velasquez (Key: [id number]) Emgality 120MG /ML auto-injectors (migraine)   Form [facility] Electronic Prior Authorization Form (2017 NCPDP) Created 13 days ago Sent to Plan 13 days ago Plan Response 13 days ago Submit Clinical Questions 13 days ago Determination Unfavorable 13 days ago Message from Plan Request Reference Number: [id number]. EMGALITY INJ 120MG /ML is denied for not meeting the prior authorization requirement(s). Details of this decision are in the notice attached below or have been faxed to you.

## 2021-11-12 NOTE — Telephone Encounter (Signed)
Michelle Velasquez (Key: [id number]) Aimovig 140MG /ML auto-injectors   Form [facility] Electronic Prior Authorization Form (2017 NCPDP) Created 13 days ago Sent to Plan 13 days ago Plan Response 13 days ago Submit Clinical Questions 13 days ago Determination Unfavorable 13 days ago Message from Plan Request Reference Number: [id number]. AIMOVIG INJ 140MG /ML is denied for not meeting the prior authorization requirement(s). Details of this decision are in the notice attached below or have been faxed to you.

## 2021-11-16 ENCOUNTER — Other Ambulatory Visit: Payer: Self-pay | Admitting: Pharmacy Technician

## 2021-11-17 ENCOUNTER — Telehealth: Payer: Self-pay | Admitting: Pharmacy Technician

## 2021-11-17 NOTE — Telephone Encounter (Signed)
Auth Submission: [place] RENEWAL Payer: [employer] Medication & CPT/J Code(s) submitted: Tysabri (Natalizumab) [id number] Route of submission (phone, fax, portal): PORTAL Auth type: Buy/Bill Units/visits requested: 300MG  Q4WK Reference number: Approval from: 11/17/21 to 11/17/22 .

## 2021-11-20 ENCOUNTER — Other Ambulatory Visit: Payer: Self-pay

## 2021-11-20 ENCOUNTER — Ambulatory Visit (INDEPENDENT_AMBULATORY_CARE_PROVIDER_SITE_OTHER): Payer: 59

## 2021-11-20 VITALS — BP 141/88 | HR 87 | Temp 98.2°F | Resp 18 | Ht 64.0 in | Wt 364.8 lb

## 2021-11-20 DIAGNOSIS — G35 Multiple sclerosis: Secondary | ICD-10-CM

## 2021-11-20 MED ORDER — SODIUM CHLORIDE 0.9 % IV SOLN: INTRAVENOUS | Status: DC

## 2021-11-20 MED ORDER — FAMOTIDINE IN NACL 20-0.9 MG/50ML-% IV SOLN: 20.0000 mg | Freq: Once | INTRAVENOUS | Status: DC | PRN

## 2021-11-20 MED ORDER — SODIUM CHLORIDE 0.9 % IV SOLN: Freq: Once | INTRAVENOUS | Status: DC | PRN

## 2021-11-20 MED ORDER — ALBUTEROL SULFATE HFA 108 (90 BASE) MCG/ACT IN AERS: 2.0000 | INHALATION_SPRAY | Freq: Once | RESPIRATORY_TRACT | Status: DC | PRN

## 2021-11-20 MED ORDER — EPINEPHRINE 0.3 MG/0.3ML IJ SOAJ: 0.3000 mg | Freq: Once | INTRAMUSCULAR | Status: DC | PRN

## 2021-11-20 MED ORDER — METHYLPREDNISOLONE SODIUM SUCC 125 MG IJ SOLR: 125.0000 mg | Freq: Once | INTRAMUSCULAR | Status: DC | PRN

## 2021-11-20 MED ORDER — SODIUM CHLORIDE 0.9 % IV SOLN
300.0000 mg | Freq: Once | INTRAVENOUS | Status: AC
  Administered 2021-11-20: 300 mg via INTRAVENOUS
  Filled 2021-11-20: qty 15

## 2021-11-20 MED ORDER — ACETAMINOPHEN 325 MG PO TABS
650.0000 mg | ORAL_TABLET | Freq: Once | ORAL | Status: AC
  Administered 2021-11-20: 650 mg via ORAL
  Filled 2021-11-20: qty 2

## 2021-11-20 MED ORDER — DIPHENHYDRAMINE HCL 50 MG/ML IJ SOLN: 50.0000 mg | Freq: Once | INTRAMUSCULAR | Status: DC | PRN

## 2021-11-20 MED ORDER — LORATADINE 10 MG PO TABS
10.0000 mg | ORAL_TABLET | Freq: Once | ORAL | Status: AC
  Administered 2021-11-20: 10 mg via ORAL
  Filled 2021-11-20: qty 1

## 2021-11-20 NOTE — Progress Notes (Signed)
Diagnosis: Multiple Sclerosis  Provider:  Marshell Garfinkel, MD  Procedure: Infusion  IV Type: Peripheral, IV Location: R Antecubital  Tysabri (Natalizumab), Dose: 300 mg  Infusion Start Time: [id number]  Infusion Stop Time: 10.56   Post Infusion IV Care: Observation period completed  Discharge: Condition: Good, Destination: Home . AVS provided to patient.   Performed by:  Paul Dykes, RN

## 2021-11-27 ENCOUNTER — Encounter: Payer: No Typology Code available for payment source | Admitting: Obstetrics & Gynecology

## 2021-12-05 ENCOUNTER — Encounter: Payer: Self-pay | Admitting: Neurology

## 2021-12-18 ENCOUNTER — Ambulatory Visit (INDEPENDENT_AMBULATORY_CARE_PROVIDER_SITE_OTHER): Payer: 59

## 2021-12-18 ENCOUNTER — Ambulatory Visit: Payer: No Typology Code available for payment source | Admitting: Nurse Practitioner

## 2021-12-18 ENCOUNTER — Other Ambulatory Visit: Payer: Self-pay

## 2021-12-18 VITALS — BP 117/77 | HR 78 | Temp 97.7°F | Resp 18 | Ht 64.0 in | Wt 362.8 lb

## 2021-12-18 DIAGNOSIS — G35 Multiple sclerosis: Secondary | ICD-10-CM | POA: Diagnosis not present

## 2021-12-18 MED ORDER — ALBUTEROL SULFATE HFA 108 (90 BASE) MCG/ACT IN AERS: 2.0000 | INHALATION_SPRAY | Freq: Once | RESPIRATORY_TRACT | Status: DC | PRN

## 2021-12-18 MED ORDER — FAMOTIDINE IN NACL 20-0.9 MG/50ML-% IV SOLN: 20.0000 mg | Freq: Once | INTRAVENOUS | Status: DC | PRN

## 2021-12-18 MED ORDER — METHYLPREDNISOLONE SODIUM SUCC 125 MG IJ SOLR: 125.0000 mg | Freq: Once | INTRAMUSCULAR | Status: DC | PRN

## 2021-12-18 MED ORDER — DIPHENHYDRAMINE HCL 50 MG/ML IJ SOLN: 50.0000 mg | Freq: Once | INTRAMUSCULAR | Status: DC | PRN

## 2021-12-18 MED ORDER — SODIUM CHLORIDE 0.9 % IV SOLN
300.0000 mg | Freq: Once | INTRAVENOUS | Status: AC
  Administered 2021-12-18: 300 mg via INTRAVENOUS
  Filled 2021-12-18: qty 15

## 2021-12-18 MED ORDER — LORATADINE 10 MG PO TABS
10.0000 mg | ORAL_TABLET | Freq: Once | ORAL | Status: AC
  Administered 2021-12-18: 10 mg via ORAL
  Filled 2021-12-18: qty 1

## 2021-12-18 MED ORDER — ACETAMINOPHEN 325 MG PO TABS
650.0000 mg | ORAL_TABLET | Freq: Once | ORAL | Status: AC
  Administered 2021-12-18: 650 mg via ORAL
  Filled 2021-12-18: qty 2

## 2021-12-18 MED ORDER — SODIUM CHLORIDE 0.9 % IV SOLN: INTRAVENOUS | Status: DC

## 2021-12-18 MED ORDER — EPINEPHRINE 0.3 MG/0.3ML IJ SOAJ: 0.3000 mg | Freq: Once | INTRAMUSCULAR | Status: DC | PRN

## 2021-12-18 MED ORDER — SODIUM CHLORIDE 0.9 % IV SOLN: Freq: Once | INTRAVENOUS | Status: DC | PRN

## 2021-12-18 NOTE — Progress Notes (Signed)
Diagnosis: Multiple Sclerosis ? ?Provider:  Chilton Greathouse, MD ? ?Procedure: Infusion ? ?IV Type: Peripheral, IV Location: L Antecubital ? ?Tysabri (Natalizumab), Dose: 300 mg ? ?Infusion Start Time: 0948am ? ?Infusion Stop Time: 1050 ? ?Post Infusion IV Care: Observation period completed and Peripheral IV Discontinued ? ?Discharge: Condition: Good, Destination: Home . AVS provided to patient.  ? ?Performed by:  Nat Math, RN  ?  ?

## 2021-12-19 ENCOUNTER — Ambulatory Visit: Payer: 59 | Admitting: Family Medicine

## 2021-12-21 ENCOUNTER — Encounter: Payer: Self-pay | Admitting: Nurse Practitioner

## 2021-12-22 ENCOUNTER — Encounter: Payer: Self-pay | Admitting: Emergency Medicine

## 2021-12-22 ENCOUNTER — Other Ambulatory Visit: Payer: Self-pay

## 2021-12-22 ENCOUNTER — Ambulatory Visit
Admission: EM | Admit: 2021-12-22 | Discharge: 2021-12-22 | Disposition: A | Discharge status: Home / Self Care | Payer: 59

## 2021-12-22 DIAGNOSIS — J069 Acute upper respiratory infection, unspecified: Secondary | ICD-10-CM

## 2021-12-22 DIAGNOSIS — J029 Acute pharyngitis, unspecified: Secondary | ICD-10-CM | POA: Diagnosis not present

## 2021-12-22 NOTE — ED Triage Notes (Signed)
Patient c/o feeling fatigue, nasal drainage, no cough, sore throat, congestion x 3 days.  Patient has been tested for COVID,FLU and Strep on Saturday all were negative.  PCR for COVID and throat cx results are pending. ?

## 2021-12-22 NOTE — Discharge Instructions (Signed)
Continue medication prescribed by previous provider.  It appears that you may have a viral infection that should self resolve in the next few days.  Follow-up if symptoms persist or worsen. ?

## 2021-12-22 NOTE — ED Provider Notes (Signed)
?[place] ? ? ? ?CSN: [id number] ?Arrival date & time: 12/22/21  0818 ? ? ?  ? ?History   ?Chief Complaint ?Chief Complaint  ?Patient presents with  ? Nasal Congestion  ? ? ?HPI ?Michelle Velasquez is a 31 y.o. female.  ? ?Patient presents with fatigue, nasal drainage, sore throat, nasal congestion that has been present for approximately 3 days.  She saw a workplace healthcare provider when symptoms first started and was tested for COVID, flu, strep that were all negative.  Throat culture and send off COVID test are pending.  She was prescribed cephalexin antibiotic for symptoms.  She reports that symptoms have been persistent.  Denies any known fevers or sick contacts.  Denies chest pain, shortness of breath, nausea, vomiting, diarrhea, abdominal pain.  Patient has not taken any additional medications to alleviate symptoms. ? ? ? ?Past Medical History:  ?Diagnosis Date  ? Allergy   ? Anxiety   ? Depression   ? Headache   ? Neuromuscular disorder ([place])   ? PCOS (polycystic ovarian syndrome)   ? ? ?Patient Active Problem List  ? Diagnosis Date Noted  ? Severe episode of recurrent major depressive disorder, without psychotic features ([place]) 07/07/2021  ? Multiple sclerosis ([place]) 12/27/2020  ? Migraine with aura and without status migrainosus, not intractable 05/16/2020  ? BMI 60.0-69.9, adult ([place]) 05/16/2020  ? Dysmenorrhea 05/16/2020  ? Menorrhagia with irregular cycle 05/16/2020  ? ? ?History reviewed. No pertinent surgical history. ? ?OB History   ? ? Gravida  ?0  ? Para  ?0  ? Term  ?0  ? Preterm  ?0  ? AB  ?0  ? Living  ?0  ?  ? ? SAB  ?0  ? IAB  ?0  ? Ectopic  ?0  ? Multiple  ?0  ? Live Births  ?0  ?   ?  ?  ? ? ? ?Home Medications   ? ?Prior to Admission medications   ?Medication Sig Start Date End Date Taking? Authorizing Provider  ?cephALEXin (KEFLEX) 500 MG capsule Take 500 mg by mouth 2 (two) times daily. 12/20/21  Yes [provider]  ?Cholecalciferol (D3-50) 1.25 MG (50000 UT) capsule  Take 1 capsule (50,000 Units total) by mouth every 7 (seven) days. 02/12/21  Yes Tomi Likens, Adam R, DO  ?gabapentin (NEURONTIN) 300 MG capsule Take 1 capsule (300 mg total) by mouth 3 (three) times daily. 08/04/21  Yes Pieter Partridge, DO  ?Natalizumab (TYSABRI IV) Inject into the vein.   Yes [provider]  ?ondansetron (ZOFRAN) 4 MG tablet Take 1-2 tablets (4-8 mg total) by mouth every 8 (eight) hours as needed. 10/24/20  Yes Tomi Likens, Adam R, DO  ?rizatriptan (MAXALT-MLT) 10 MG disintegrating tablet Take 1 tablet earliest onset of migraine.  May repeat in 2 hours if needed.  Maximum 2 tablets in 24 hours. 10/24/20  Yes Pieter Partridge, DO  ?Erenumab-aooe (AIMOVIG) 140 MG/ML SOAJ Inject 140 mg into the skin every 30 (thirty) days. 10/17/21   Pieter Partridge, DO  ?Galcanezumab-gnlm (EMGALITY) 120 MG/ML SOAJ Inject 120 mg into the skin every 30 (thirty) days. 10/28/21   Pieter Partridge, DO  ?ramelteon (ROZEREM) 8 MG tablet Take 1 tablet (8 mg total) by mouth at bedtime. 02/26/21   Laurin Coder, MD  ?venlafaxine XR (EFFEXOR-XR) 75 MG 24 hr capsule Take 1 capsule (75 mg total) by mouth daily with breakfast. 10/03/21   Pieter Partridge, DO  ? ? ?Family  History ?Family History  ?Problem Relation Age of Onset  ? Diabetes Father   ? Multiple sclerosis Mother   ? Fibroids Mother   ? Fibromyalgia Mother   ? ? ?Social History ?Social History  ? ?Tobacco Use  ? Smoking status: Never  ? Smokeless tobacco: Never  ?Vaping Use  ? Vaping Use: Never used  ?Substance Use Topics  ? Alcohol use: Never  ? Drug use: Never  ? ? ? ?Allergies   ?Sulfa antibiotics, Gadolinium derivatives, and Grass pollen(k-o-r-t-swt vern) ? ? ?Review of Systems ?Review of Systems ?Per HPI ? ?Physical Exam ?Triage Vital Signs ?ED Triage Vitals  ?Enc Vitals Group  ?   BP 12/22/21 0837 (!) 158/106  ?   Pulse Rate 12/22/21 0837 87  ?   Resp 12/22/21 0837 18  ?   Temp 12/22/21 0837 98.2 ?F (36.8 ?C)  ?   Temp Source 12/22/21 0837 Oral  ?   SpO2 12/22/21 0837 96 %  ?    Weight 12/22/21 0838 (!) 360 lb (163.3 kg)  ?   Height 12/22/21 0838 5\' 4"  (1.626 m)  ?   Head Circumference --   ?   Peak Flow --   ?   Pain Score 12/22/21 0838 8  ?   Pain Loc --   ?   Pain Edu? --   ?   Excl. in [place]? --   ? ?No data found. ? ?Updated Vital Signs ?BP (!) 158/106 (BP Location: Left Arm)   Pulse 87   Temp 98.2 ?F (36.8 ?C) (Oral)   Resp 18   Ht 5\' 4"  (1.626 m)   Wt (!) 360 lb (163.3 kg)   SpO2 96%   BMI 61.79 kg/m?  ? ?Visual Acuity ?Right Eye Distance:   ?Left Eye Distance:   ?Bilateral Distance:   ? ?Right Eye Near:   ?Left Eye Near:    ?Bilateral Near:    ? ?Physical Exam ?Constitutional:   ?   General: She is not in acute distress. ?   Appearance: Normal appearance. She is not toxic-appearing or diaphoretic.  ?HENT:  ?   Head: Normocephalic and atraumatic.  ?   Right Ear: Tympanic membrane and ear canal normal.  ?   Left Ear: Tympanic membrane and ear canal normal.  ?   Nose: Congestion present.  ?   Mouth/Throat:  ?   Mouth: Mucous membranes are moist.  ?   Pharynx: No posterior oropharyngeal erythema.  ?Eyes:  ?   Extraocular Movements: Extraocular movements intact.  ?   Conjunctiva/sclera: Conjunctivae normal.  ?   Pupils: Pupils are equal, round, and reactive to light.  ?Cardiovascular:  ?   Rate and Rhythm: Normal rate and regular rhythm.  ?   Pulses: Normal pulses.  ?   Heart sounds: Normal heart sounds.  ?Pulmonary:  ?   Effort: Pulmonary effort is normal. No respiratory distress.  ?   Breath sounds: Normal breath sounds. No stridor. No wheezing, rhonchi or rales.  ?Abdominal:  ?   General: Abdomen is flat. Bowel sounds are normal.  ?   Palpations: Abdomen is soft.  ?Musculoskeletal:     ?   General: Normal range of motion.  ?   Cervical back: Normal range of motion.  ?Skin: ?   General: Skin is warm and dry.  ?Neurological:  ?   General: No focal deficit present.  ?   Mental Status: She is alert and oriented to person, place, and time. Mental  status is at baseline.  ?Psychiatric:      ?   Mood and Affect: Mood normal.     ?   Behavior: Behavior normal.  ? ? ? ?UC Treatments / Results  ?Labs ?(all labs ordered are listed, but only abnormal results are displayed) ?Labs Reviewed - No data to display ? ?EKG ? ? ?Radiology ?No results found. ? ?Procedures ?Procedures (including critical care time) ? ?Medications Ordered in UC ?Medications - No data to display ? ?Initial Impression / Assessment and Plan / UC Course  ?I have reviewed the triage vital signs and the nursing notes. ? ?Pertinent labs & imaging results that were available during my care of the patient were reviewed by me and considered in my medical decision making (see chart for details). ? ?  ? ?Patient presents with symptoms likely from a viral upper respiratory infection. Differential includes bacterial pneumonia, sinusitis, allergic rhinitis, COVID-19, flu. Do not suspect underlying cardiopulmonary process. Symptoms seem unlikely related to ACS, CHF or COPD exacerbations, pneumonia, pneumothorax. Patient is nontoxic appearing and not in need of emergent medical intervention.  Do not think that any additional testing is necessary given the patient has received multiple tests at previous healthcare provider. ? ?Recommended symptom control with over the counter medications.  She may continue cephalexin antibiotic, although I do not think it is necessary given that it appears to be viral. ? ?Return if symptoms fail to improve in 1-2 weeks or you develop shortness of breath, chest pain, severe headache. Patient states understanding and is agreeable. ? ?Discharged with PCP followup.  ?Final Clinical Impressions(s) / UC Diagnoses  ? ?Final diagnoses:  ?Viral upper respiratory infection  ?Sore throat  ? ? ? ?Discharge Instructions   ? ?  ?Continue medication prescribed by previous provider.  It appears that you may have a viral infection that should self resolve in the next few days.  Follow-up if symptoms persist or worsen. ? ? ? ?ED  Prescriptions   ?None ?  ? ?PDMP not reviewed this encounter. ?  ?Teodora Medici,  ?12/22/21 [id number] ? ?

## 2021-12-26 ENCOUNTER — Ambulatory Visit: Payer: 59 | Admitting: Family Medicine

## 2021-12-26 ENCOUNTER — Telehealth: Payer: Self-pay | Admitting: Pharmacy Technician

## 2021-12-26 NOTE — Telephone Encounter (Addendum)
error 

## 2021-12-26 NOTE — Telephone Encounter (Signed)
Tysabri co-pay: ? ?Patient must initiate process for co-pay card. ?Can not be initiated/started by MD office. ?Left v/m with patient. ? ?

## 2021-12-30 NOTE — Telephone Encounter (Signed)
Tysabri co-pay card. ? Phone:[phone] ?Left 2nd v/m with patient in regards to co-pay. Awaiting call back

## 2022-01-06 ENCOUNTER — Other Ambulatory Visit: Payer: Self-pay

## 2022-01-06 ENCOUNTER — Encounter: Payer: Self-pay | Admitting: Neurology

## 2022-01-06 DIAGNOSIS — G35 Multiple sclerosis: Secondary | ICD-10-CM

## 2022-01-06 NOTE — Telephone Encounter (Signed)
Tysabri co-pay card: ?3rd attempt to contact patient in regards to co-pay card.

## 2022-01-06 NOTE — Progress Notes (Signed)
New orders added for [facility] per pt she has a allergy to MRI Contrast.  ?

## 2022-01-11 ENCOUNTER — Encounter: Payer: Self-pay | Admitting: Emergency Medicine

## 2022-01-11 ENCOUNTER — Ambulatory Visit
Admission: EM | Admit: 2022-01-11 | Discharge: 2022-01-11 | Disposition: A | Discharge status: Home / Self Care | Payer: 59 | Attending: Emergency Medicine | Admitting: Emergency Medicine

## 2022-01-11 ENCOUNTER — Other Ambulatory Visit: Payer: Self-pay

## 2022-01-11 DIAGNOSIS — J9801 Acute bronchospasm: Secondary | ICD-10-CM

## 2022-01-11 DIAGNOSIS — J302 Other seasonal allergic rhinitis: Secondary | ICD-10-CM

## 2022-01-11 DIAGNOSIS — J329 Chronic sinusitis, unspecified: Secondary | ICD-10-CM

## 2022-01-11 DIAGNOSIS — B349 Viral infection, unspecified: Secondary | ICD-10-CM | POA: Diagnosis not present

## 2022-01-11 DIAGNOSIS — J310 Chronic rhinitis: Secondary | ICD-10-CM

## 2022-01-11 DIAGNOSIS — R509 Fever, unspecified: Secondary | ICD-10-CM

## 2022-01-11 LAB — POCT INFLUENZA A/B
Influenza A, POC: NEGATIVE
Influenza B, POC: NEGATIVE

## 2022-01-11 MED ORDER — PROMETHAZINE-DM 6.25-15 MG/5ML PO SYRP: 5.0000 mL | ORAL_SOLUTION | Freq: Four times a day (QID) | ORAL | 0 refills | Status: DC | PRN

## 2022-01-11 MED ORDER — CETIRIZINE HCL 10 MG PO TABS: 10.0000 mg | ORAL_TABLET | Freq: Every day | ORAL | 1 refills | Status: DC

## 2022-01-11 MED ORDER — GUAIFENESIN 400 MG PO TABS: ORAL_TABLET | ORAL | 0 refills | Status: DC

## 2022-01-11 MED ORDER — ALBUTEROL SULFATE HFA 108 (90 BASE) MCG/ACT IN AERS: 2.0000 | INHALATION_SPRAY | Freq: Four times a day (QID) | RESPIRATORY_TRACT | 2 refills | Status: DC | PRN

## 2022-01-11 MED ORDER — FLUTICASONE PROPIONATE 50 MCG/ACT NA SUSP: 1.0000 | Freq: Every day | NASAL | 1 refills | Status: DC

## 2022-01-11 MED ORDER — AZITHROMYCIN 250 MG PO TABS: 500.0000 mg | ORAL_TABLET | Freq: Every day | ORAL | 0 refills | Status: AC

## 2022-01-11 MED ORDER — IPRATROPIUM BROMIDE 0.06 % NA SOLN: 2.0000 | Freq: Four times a day (QID) | NASAL | 1 refills | Status: DC

## 2022-01-11 MED ORDER — ALBUTEROL SULFATE (2.5 MG/3ML) 0.083% IN NEBU
2.5000 mg | INHALATION_SOLUTION | Freq: Once | RESPIRATORY_TRACT | Status: AC
  Administered 2022-01-11: 2.5 mg via RESPIRATORY_TRACT

## 2022-01-11 MED ORDER — METHYLPREDNISOLONE SODIUM SUCC 125 MG IJ SOLR
125.0000 mg | Freq: Once | INTRAMUSCULAR | Status: AC
  Administered 2022-01-11: 125 mg via INTRAMUSCULAR

## 2022-01-11 MED ORDER — IBUPROFEN 400 MG PO TABS: 400.0000 mg | ORAL_TABLET | Freq: Three times a day (TID) | ORAL | 0 refills | Status: DC | PRN

## 2022-01-11 NOTE — Discharge Instructions (Addendum)
Your symptoms and my physical exam findings are concerning for exacerbation of your underlying allergies.  It is important that you are consistent with taking allergy medications exactly as prescribed.   ?  ?Please see the list below for recommended medications, dosages and frequencies to provide relief of your current symptoms:  ? ?Azithromycin (Z-Pak):  take 2 tablets twice daily for 3 days. ? ?Guaifenesin (Robitussin, Mucinex): This is an expectorant.  This helps break up chest congestion and loosen up thick nasal drainage making phlegm and drainage more liquid and therefore easier to remove.  I recommend being 400 mg three times daily as needed.    ?  ?Promethazine DM: Promethazine is both a nasal decongestant and an antinausea medication that makes most patients feel fairly sleepy.  The DM is dextromethorphan, a cough suppressant found in many over-the-counter cough medications.  Please take 5 mL before bedtime to minimize your cough which will help you sleep better.  I have provided you with a prescription for this medication.    ?  ?Methylprednisolone IM (Solu-Medrol):  To quickly address your significant respiratory inflammation, you were provided with an injection of methylprednisolone in the office today.  You should continue to feel the full benefit of the steroid for the next 4 to 6 hours.  ? ?ProAir, Ventolin, Proventil (albuterol): This inhaled medication contains a short acting beta agonist bronchodilator.  This medication works on the smooth muscle that opens and constricts of your airways by relaxing the muscle.  The result of relaxation of the smooth muscle is increased air movement and improved work of breathing.  This is a short acting medication that can be used every 4-6 hours as needed for increased work of breathing, shortness of breath, wheezing and excessive coughing.  I have provided you with a prescription.  ?  ?Flonase (fluticasone): This is a steroid nasal spray that you use once daily,  1 spray in each nare.  This medication does not work well if you decide to use it only used as you feel you need to, it works best used on a daily basis.  After 3 to 5 days of use, you will notice significant reduction of the inflammation and mucus production that is currently being caused by exposure to allergens, whether seasonal or environmental.  The most common side effect of this medication is nosebleeds.  If you experience a nosebleed, please discontinue use for 1 week, then feel free to resume.  I have provided you with a prescription but you can also purchase this medication over-the-counter if your insurance will not cover it. ?  ?Ipratropium (Atrovent): This is an excellent nasal decongestant spray that does not cause rebound congestion, please instill 2 sprays into each nare with each use.  Because nasal steroids can take several days before they begin to provide full benefit, I recommend that you use this spray in addition to the nasal steroid prescribed for you.  Please use it after you have used your nasal steroid and repeat up to 4 times daily as needed.  I have provided you with a prescription for this medication.    ?  ?Zyrtec (cetirizine): This is an excellent second-generation antihistamine that helps to reduce respiratory inflammatory response to environmental allergens.  In some patients, this medication can cause daytime sleepiness so I recommend that you take 1 tablet daily at bedtime.   ? ?Please be sure you continue using Flonase and Zyrtec all the way through June of this year.  If you  feel like you need to continue to add the Atrovent nasal spray periodically, please feel free to do so as well.   ? ?If you find that you have not had significant relief of your symptoms in the next 7 to 10 days, please follow-up with your primary care provider or follow-up with [place] here at urgent care. ?  ?Thank you for visiting urgent care today.  We appreciate the opportunity to participate in your  care. ? ? ?

## 2022-01-11 NOTE — ED Provider Notes (Signed)
?[facility] ? ? ? ?CSN: [id number] ?Arrival date & time: 01/11/22  0825 ?  ? ?HISTORY  ? ?Chief Complaint  ?Patient presents with  ? Cough  ? ?HPI ?Michelle Velasquez is a 31 y.o. female. Patient complains of fever, Tmax 101.8, body aches and chills that began yesterday evening.  Patient states cough and congestion became worse today.  Patient states she took a NyQuil last night, has not had any medications prior to arrival this morning.  Patient states she was here on March 6 with similar complaints, was not advised to take any medications other than to complete the antibiotic she had been prescribed by another provider prior to that visit.  Patient states she had mild improvement of her symptoms after finishing the antibiotics but that they came back even worse.  Patient states she works at [employer], does everything she can to stay well.  Patient states he is currently taking Zyrtec daily but does not take any other medications for her known seasonal allergies. ? ? ?Cough ?Past Medical History:  ?Diagnosis Date  ? Allergy   ? Anxiety   ? Depression   ? Headache   ? Neuromuscular disorder (HCC)   ? PCOS (polycystic ovarian syndrome)   ? ?Patient Active Problem List  ? Diagnosis Date Noted  ? Severe episode of recurrent major depressive disorder, without psychotic features (HCC) 07/07/2021  ? Multiple sclerosis (HCC) 12/27/2020  ? Migraine with aura and without status migrainosus, not intractable 05/16/2020  ? BMI 60.0-69.9, adult (HCC) 05/16/2020  ? Dysmenorrhea 05/16/2020  ? Menorrhagia with irregular cycle 05/16/2020  ? ?History reviewed. No pertinent surgical history. ?OB History   ? ? Gravida  ?0  ? Para  ?0  ? Term  ?0  ? Preterm  ?0  ? AB  ?0  ? Living  ?0  ?  ? ? SAB  ?0  ? IAB  ?0  ? Ectopic  ?0  ? Multiple  ?0  ? Live Births  ?0  ?   ?  ?  ? ?Home Medications   ? ?Prior to Admission medications   ?Medication Sig Start Date End Date Taking? Authorizing Provider  ?cephALEXin  (KEFLEX) 500 MG capsule Take 500 mg by mouth 2 (two) times daily. 12/20/21  Yes [provider]  ?Cholecalciferol (D3-50) 1.25 MG (50000 UT) capsule Take 1 capsule (50,000 Units total) by mouth every 7 (seven) days. 02/12/21  Yes Everlena Cooper, Adam R, DO  ?gabapentin (NEURONTIN) 300 MG capsule Take 1 capsule (300 mg total) by mouth 3 (three) times daily. 08/04/21  Yes Drema Dallas, DO  ?Galcanezumab-gnlm (EMGALITY) 120 MG/ML SOAJ Inject 120 mg into the skin every 30 (thirty) days. 10/28/21  Yes Drema Dallas, DO  ?Natalizumab (TYSABRI IV) Inject into the vein.   Yes [provider]  ?ondansetron (ZOFRAN) 4 MG tablet Take 1-2 tablets (4-8 mg total) by mouth every 8 (eight) hours as needed. 10/24/20  Yes Everlena Cooper, Adam R, DO  ?rizatriptan (MAXALT-MLT) 10 MG disintegrating tablet Take 1 tablet earliest onset of migraine.  May repeat in 2 hours if needed.  Maximum 2 tablets in 24 hours. 10/24/20  Yes Drema Dallas, DO  ?Erenumab-aooe (AIMOVIG) 140 MG/ML SOAJ Inject 140 mg into the skin every 30 (thirty) days. 10/17/21   Drema Dallas, DO  ?ramelteon (ROZEREM) 8 MG tablet Take 1 tablet (8 mg total) by mouth at bedtime. 02/26/21   Tomma Lightning, MD  ?venlafaxine XR (EFFEXOR-XR) 75  MG 24 hr capsule Take 1 capsule (75 mg total) by mouth daily with breakfast. 10/03/21   Drema Dallas, DO  ? ?Family History ?Family History  ?Problem Relation Age of Onset  ? Diabetes Father   ? Multiple sclerosis Mother   ? Fibroids Mother   ? Fibromyalgia Mother   ? ?Social History ?Social History  ? ?Tobacco Use  ? Smoking status: Never  ? Smokeless tobacco: Never  ?Vaping Use  ? Vaping Use: Never used  ?Substance Use Topics  ? Alcohol use: Never  ? Drug use: Never  ? ?Allergies   ?Sulfa antibiotics, Gadolinium derivatives, and Grass pollen(k-o-r-t-swt vern) ? ?Review of Systems ?Review of Systems  ?Respiratory:  Positive for cough.   ?Pertinent findings noted in history of present illness.  ? ?Physical Exam ?Triage Vital Signs ?ED  Triage Vitals  ?Enc Vitals Group  ?   BP 08/15/21 0827 (!) 147/82  ?   Pulse Rate 08/15/21 0827 72  ?   Resp 08/15/21 0827 18  ?   Temp 08/15/21 0827 98.3 ?F (36.8 ?C)  ?   Temp Source 08/15/21 0827 Oral  ?   SpO2 08/15/21 0827 98 %  ?   Weight --   ?   Height --   ?   Head Circumference --   ?   Peak Flow --   ?   Pain Score 08/15/21 0826 5  ?   Pain Loc --   ?   Pain Edu? --   ?   Excl. in GC? --   ?No data found. ? ?Updated Vital Signs ?BP (!) 144/96 (BP Location: Left Arm)   Pulse (!) 107   Temp 99.1 ?F (37.3 ?C) (Oral)   Resp 18   Ht 5\' 4"  (1.626 m)   Wt (!) 360 lb 0.2 oz (163.3 kg)   SpO2 93%   BMI 61.80 kg/m?  ? ?Physical Exam ?Vitals and nursing note reviewed.  ?Constitutional:   ?   General: She is not in acute distress. ?   Appearance: Normal appearance. She is not ill-appearing.  ?HENT:  ?   Head: Normocephalic and atraumatic.  ?   Salivary Glands: Right salivary gland is not diffusely enlarged or tender. Left salivary gland is not diffusely enlarged or tender.  ?   Right Ear: Ear canal and external ear normal. No drainage. A middle ear effusion is present. There is no impacted cerumen. Tympanic membrane is bulging. Tympanic membrane is not injected or erythematous.  ?   Left Ear: Ear canal and external ear normal. No drainage. A middle ear effusion is present. There is no impacted cerumen. Tympanic membrane is bulging. Tympanic membrane is not injected or erythematous.  ?   Ears:  ?   Comments: Bilateral EACs normal, both TMs bulging with clear fluid ?   Nose: Rhinorrhea present. No nasal deformity, septal deviation, signs of injury, nasal tenderness, mucosal edema or congestion. Rhinorrhea is clear.  ?   Right Nostril: Occlusion present. No foreign body, epistaxis or septal hematoma.  ?   Left Nostril: Occlusion present. No foreign body, epistaxis or septal hematoma.  ?   Right Turbinates: Enlarged, swollen and pale.  ?   Left Turbinates: Enlarged, swollen and pale.  ?   Right Sinus: No maxillary  sinus tenderness or frontal sinus tenderness.  ?   Left Sinus: No maxillary sinus tenderness or frontal sinus tenderness.  ?   Mouth/Throat:  ?   Lips: Pink. No lesions.  ?  Mouth: Mucous membranes are moist. No oral lesions.  ?   Pharynx: Oropharynx is clear. Uvula midline. No posterior oropharyngeal erythema or uvula swelling.  ?   Tonsils: No tonsillar exudate. 0 on the right. 0 on the left.  ?   Comments: Postnasal drip ?Eyes:  ?   General: Lids are normal.     ?   Right eye: No discharge.     ?   Left eye: No discharge.  ?   Extraocular Movements: Extraocular movements intact.  ?   Conjunctiva/sclera: Conjunctivae normal.  ?   Right eye: Right conjunctiva is not injected.  ?   Left eye: Left conjunctiva is not injected.  ?Neck:  ?   Trachea: Trachea and phonation normal.  ?Cardiovascular:  ?   Rate and Rhythm: Normal rate and regular rhythm.  ?   Pulses: Normal pulses.  ?   Heart sounds: Normal heart sounds. No murmur heard. ?  No friction rub. No gallop.  ?Pulmonary:  ?   Effort: Pulmonary effort is normal. No accessory muscle usage, prolonged expiration or respiratory distress.  ?   Breath sounds: No stridor, decreased air movement or transmitted upper airway sounds. Examination of the right-upper field reveals decreased breath sounds. Examination of the left-upper field reveals decreased breath sounds. Examination of the right-middle field reveals decreased breath sounds. Examination of the left-middle field reveals decreased breath sounds. Examination of the right-lower field reveals decreased breath sounds. Examination of the left-lower field reveals decreased breath sounds. Decreased breath sounds present. No wheezing, rhonchi or rales.  ?   Comments: Repeat auscultation post nebulized albuterol revealed improved breath sounds throughout without wheeze, rale or rhonchi. ?Chest:  ?   Chest wall: No tenderness.  ?Abdominal:  ?   General: Abdomen is flat. Bowel sounds are normal.  ?   Palpations: Abdomen is  soft.  ?Musculoskeletal:     ?   General: Normal range of motion.  ?   Cervical back: Normal range of motion and neck supple. Normal range of motion.  ?Lymphadenopathy:  ?   Cervical: No cervical adenopathy.  ?Skin:

## 2022-01-11 NOTE — ED Triage Notes (Signed)
Patient c/o fever, body aches and chills, cough, congestion x 1 day.  Fever of 101.8 last night.  Patient taken Nyquil and Dayquil. ?

## 2022-01-22 ENCOUNTER — Telehealth: Payer: Self-pay | Admitting: Pharmacy Technician

## 2022-01-22 ENCOUNTER — Encounter: Payer: Self-pay | Admitting: Neurology

## 2022-01-22 ENCOUNTER — Ambulatory Visit (INDEPENDENT_AMBULATORY_CARE_PROVIDER_SITE_OTHER): Payer: 59

## 2022-01-22 VITALS — BP 129/85 | HR 87 | Temp 97.7°F | Resp 18 | Ht 64.0 in | Wt 362.2 lb

## 2022-01-22 DIAGNOSIS — G35 Multiple sclerosis: Secondary | ICD-10-CM | POA: Diagnosis not present

## 2022-01-22 MED ORDER — SODIUM CHLORIDE 0.9 % IV SOLN
300.0000 mg | Freq: Once | INTRAVENOUS | Status: AC
  Administered 2022-01-22: 300 mg via INTRAVENOUS
  Filled 2022-01-22: qty 15

## 2022-01-22 MED ORDER — ACETAMINOPHEN 325 MG PO TABS
650.0000 mg | ORAL_TABLET | Freq: Once | ORAL | Status: AC
  Administered 2022-01-22: 650 mg via ORAL
  Filled 2022-01-22: qty 2

## 2022-01-22 MED ORDER — LORATADINE 10 MG PO TABS
10.0000 mg | ORAL_TABLET | Freq: Once | ORAL | Status: AC
  Administered 2022-01-22: 10 mg via ORAL
  Filled 2022-01-22: qty 1

## 2022-01-22 MED ORDER — SODIUM CHLORIDE 0.9 % IV SOLN: INTRAVENOUS | Status: DC

## 2022-01-22 NOTE — Telephone Encounter (Addendum)
Tysabri co-pay card: APPROVED ? ?ID: [id number] ?GR: [id number] ?BIN: [id number] ?PCN: [id number] ?ACCT# [id number] ?CVV# 651 ?EXP: 04/18/23 ?$20,000/YR ?Fax claim: [id number] ?Phone: [phone] ? ?

## 2022-01-22 NOTE — Progress Notes (Signed)
Diagnosis: Multiple Sclerosis ? ?Provider:  Chilton Greathouse, MD ? ?Procedure: Infusion ? ?IV Type: Peripheral, IV Location: L Antecubital ? ?Tysabri (Natalizumab), Dose: 300 mg ? ?Infusion Start Time: 1009 ? ?Infusion Stop Time: 1113 ? ?Post Infusion IV Care: Observation period completed and Peripheral IV Discontinued ? ?Discharge: Condition: Good, Destination: Home . AVS provided to patient.  ? ?Performed by:  Wynetta Seith, RN  ?  ?

## 2022-01-27 ENCOUNTER — Encounter: Payer: Self-pay | Admitting: Neurology

## 2022-01-28 ENCOUNTER — Encounter: Payer: Self-pay | Admitting: Neurology

## 2022-01-28 ENCOUNTER — Other Ambulatory Visit: Payer: Self-pay

## 2022-01-28 MED ORDER — DIPHENHYDRAMINE HCL 50 MG PO TABS: ORAL_TABLET | ORAL | 0 refills | Status: DC

## 2022-01-28 MED ORDER — PREDNISONE 50 MG PO TABS
ORAL_TABLET | ORAL | 0 refills | Status: DC
Start: 2022-01-28 — End: 2022-02-17

## 2022-01-28 NOTE — Progress Notes (Signed)
13 hour pre medication order for patient before MRI Contrast.  ?

## 2022-01-30 ENCOUNTER — Other Ambulatory Visit: Payer: Self-pay

## 2022-01-30 ENCOUNTER — Ambulatory Visit (HOSPITAL_COMMUNITY)
Admission: RE | Admit: 2022-01-30 | Discharge: 2022-01-30 | Disposition: A | Discharge status: Home / Self Care | Payer: 59 | Source: Ambulatory Visit | Attending: Neurology | Admitting: Neurology

## 2022-01-30 DIAGNOSIS — G35 Multiple sclerosis: Secondary | ICD-10-CM

## 2022-01-30 IMAGING — MR MR THORACIC SPINE WO/W CM
4 of 9 series · 15 of 48 positions shown · IV contrast (gadavist)
Comparison: Comparison made with previous MRIs from [DATE] and
[DATE].

CLINICAL DATA: Follow-up examination for multiple sclerosis, no new
symptoms.

EXAM:
MRI HEAD WITHOUT AND WITH CONTRAST
MRI CERVICAL SPINE WITHOUT AND WITH CONTRAST
MRI THORACIC SPINE WITHOUT AND WITH CONTRAST
TECHNIQUE: Multiplanar, multiecho pulse sequences of the brain and surrounding
structures, and cervical spine, to include the craniocervical
junction and cervicothoracic junction, and thoracic spine were
obtained without and with intravenous contrast.
CONTRAST:  10mL GADAVIST GADOBUTROL 1 MMOL/ML IV SOLN

[Series 2: T1 · sagittal · 3.0mm · 0.90mm/px · 3 of 18 slices shown (1 of 2)]
[im 1/18]
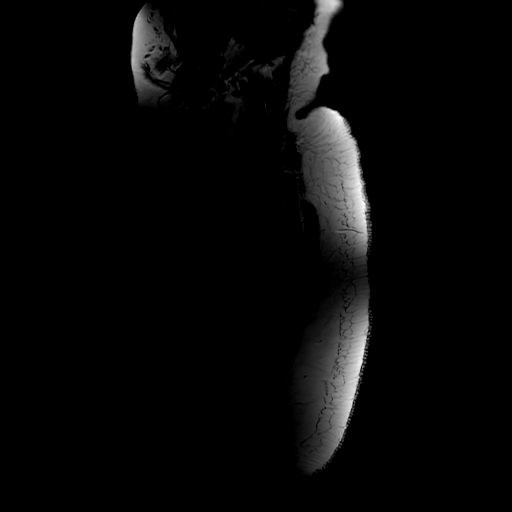
[im 9/18]
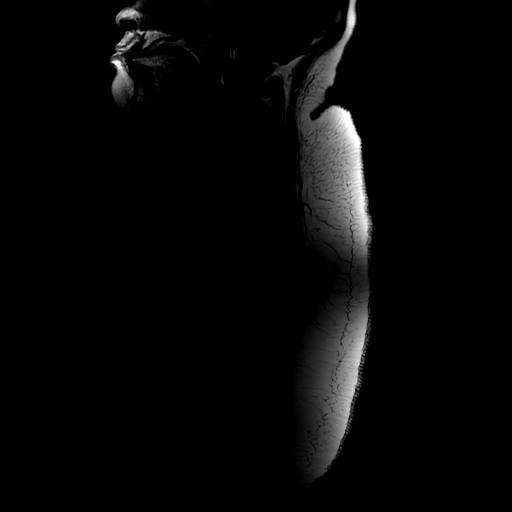
[im 18/18]
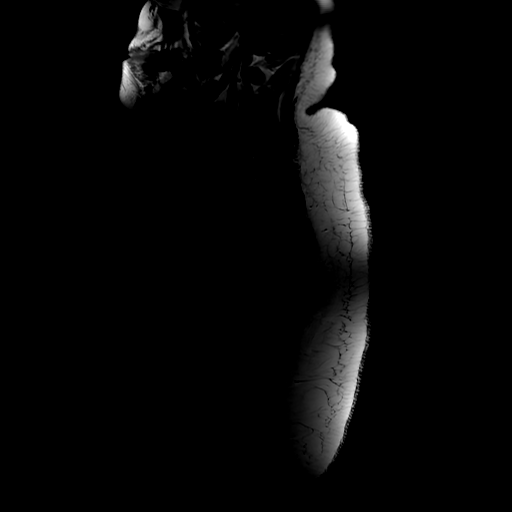

[Series 3: T2 · sagittal · 3.0mm · 0.66mm/px · 3 of 18 slices shown (1 of 2)]
[im 1/18]
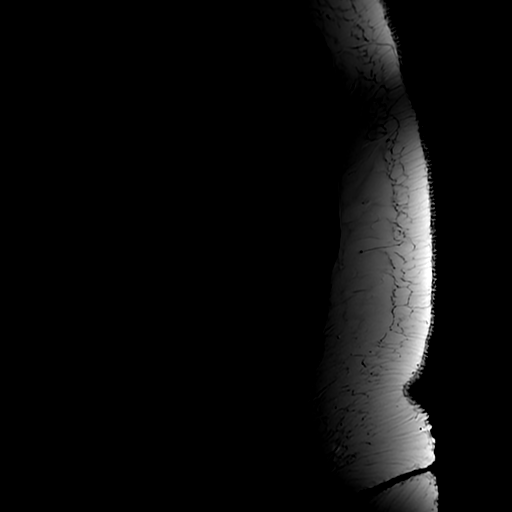
[im 9/18]
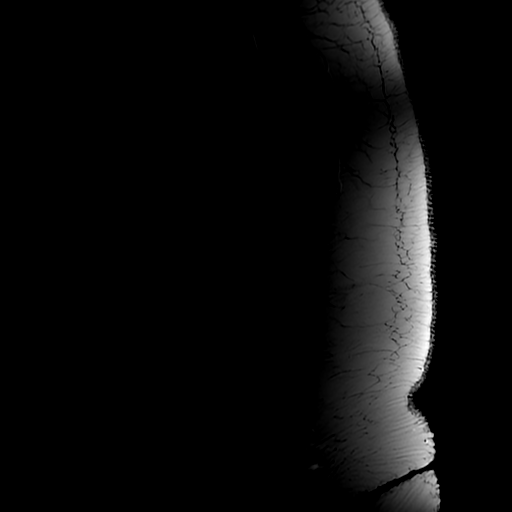
[im 18/18]
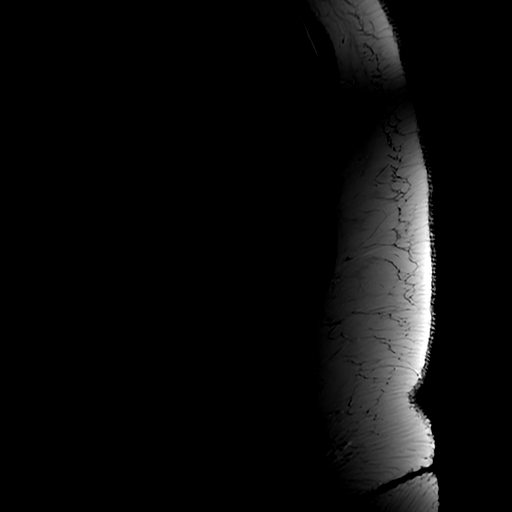

[Series 4: T1 · sagittal · 3.0mm · 0.66mm/px · 3 of 18 slices shown (2 of 2)]
[im 1/18]
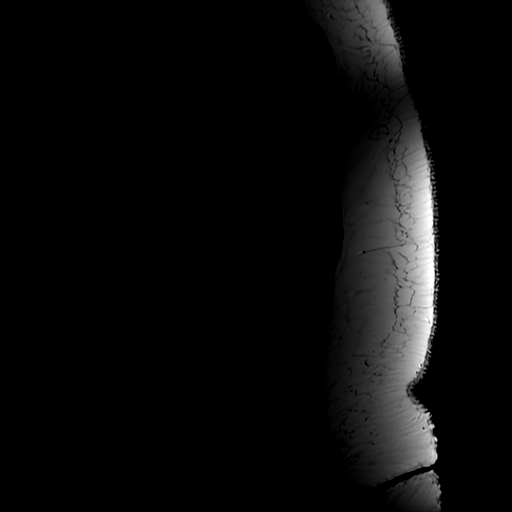
[im 9/18]
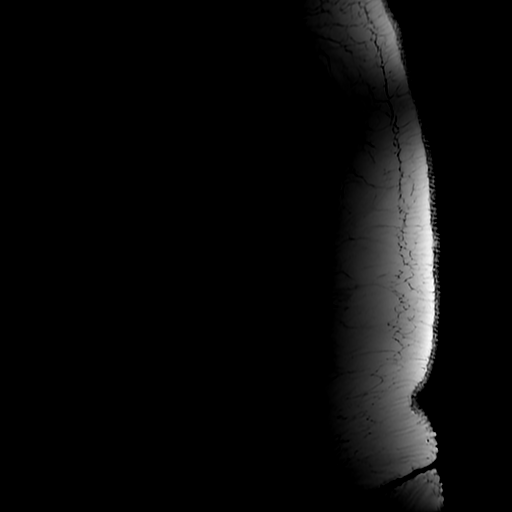
[im 18/18]
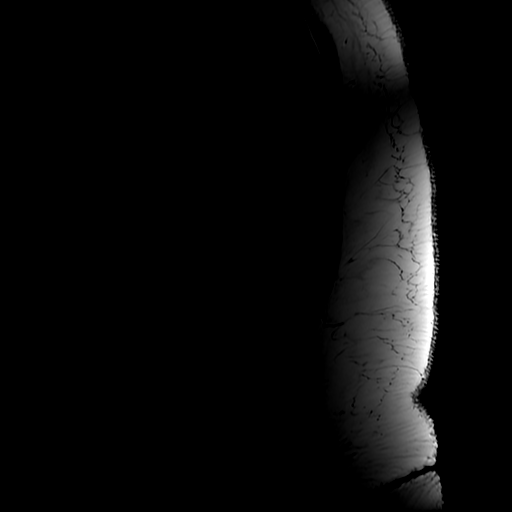

[Series 6: T2 · axial · 4.0mm · 0.39mm/px · z∈[-413,-222]mm · 6 of 39 slices shown (2 of 2)]
[im 1/39]
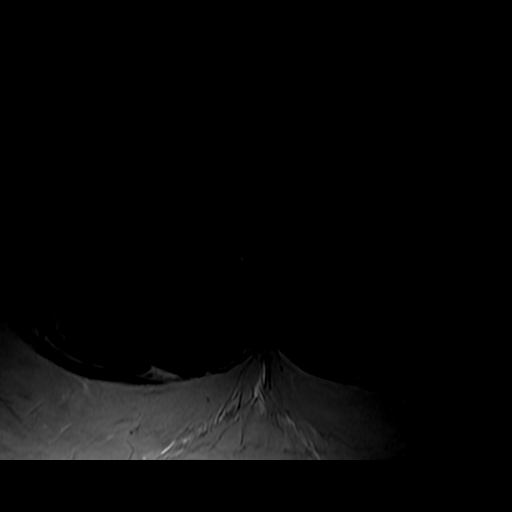
[im 6/39]
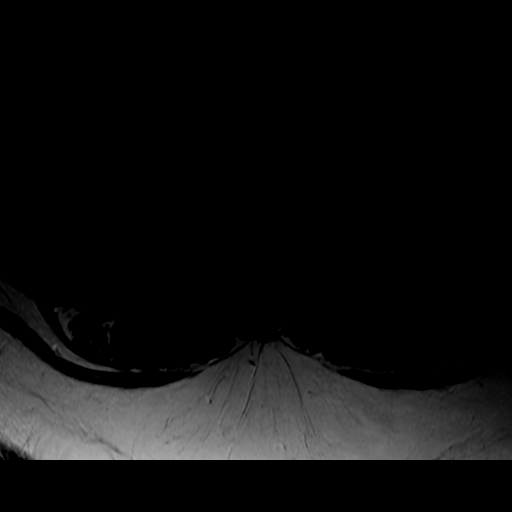
[im 11/39]
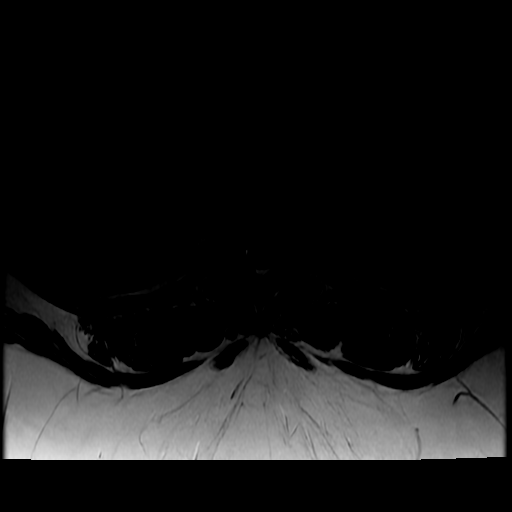
[im 17/39]
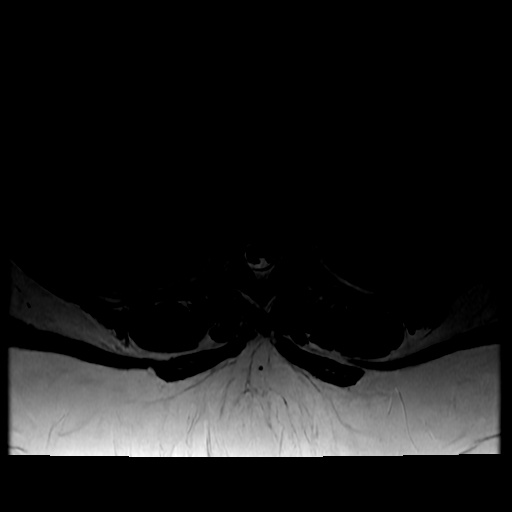
[im 22/39]
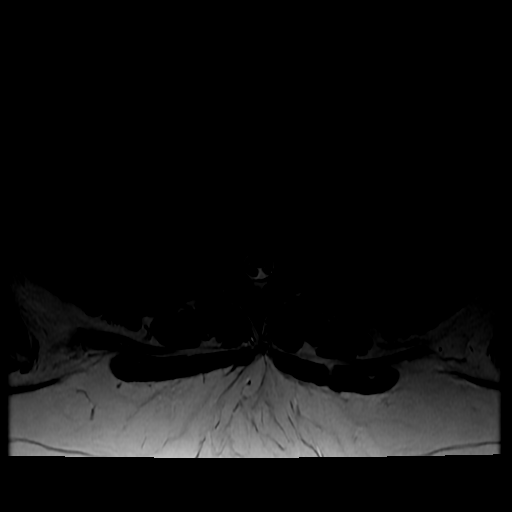
[im 33/39]
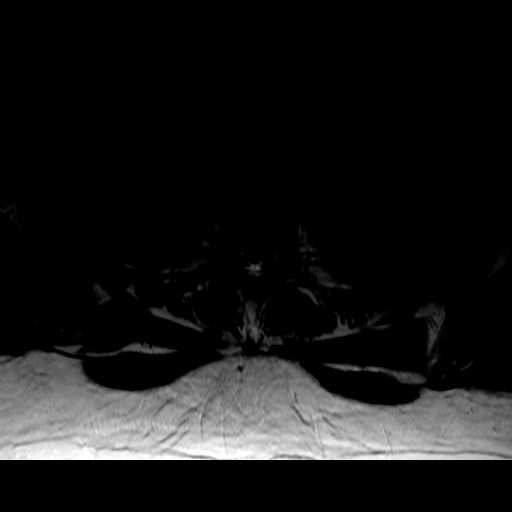

[15 of 48 positions shown; findings below may reference images not displayed]

FINDINGS: MRI HEAD FINDINGS

Brain: Examination degraded by motion.

Cerebral volume within normal limits. Again seen are scattered
patchy T2/FLAIR hyperintensities involving the periventricular,
deep, and juxta cortical white matter both cerebral hemispheres
consistent with provided history of multiple sclerosis. Overall,
number of these lesions appears fairly stable with no definite new
lesions identified. There has been interval contraction of a
previously seen active lesion at the right periatrial white matter,
now measuring 6 mm, previously 15 mm (series 7, image 20).
Otherwise, overall size and appearance of these lesions is likely
little interval changed given differences in technique. No
associated enhancement to suggest active demyelination.

No evidence for acute or subacute infarct. Gray-white matter
differentiation maintained. No encephalomalacia to suggest interval
cortical infarction. No acute or chronic intracranial blood
products.

No mass lesion, mass effect or midline shift. No hydrocephalus or
extra-axial fluid collection. Pituitary gland suprasellar region
within normal limits. Midline structures intact and normal. No other
abnormal enhancement.

Vascular: Major intracranial vascular flow voids are maintained.

Skull and upper cervical spine: Craniocervical junction within
normal limits. Bone marrow signal intensity diffusely decreased on
T1 weighted sequence, nonspecific, but most commonly related to
anemia, smoking or obesity. No focal marrow replacing lesion. No
scalp soft tissue abnormality.

Sinuses/Orbits: Globes orbital soft tissues demonstrate no acute
finding. Paranasal sinuses are clear. No mastoid effusion.

Other: None.

MRI CERVICAL SPINE FINDINGS

Alignment: Examination degraded by motion artifact.

Straightening of the normal cervical lordosis.  No listhesis.

Vertebrae: Vertebral body height maintained without acute or chronic
fracture. Bone marrow signal intensity diffusely decreased on T1
weighted sequence, nonspecific, but most commonly related to anemia,
smoking or obesity. Benign hemangioma noted within the T1 vertebral
body. No other discrete or worrisome osseous lesions. No abnormal
marrow edema or enhancement.

Cord: Again seen is a focal cord lesion involving the left hemi cord
at the level of C2-3 (series 4, image 11), relatively stable from
prior. This lesion demonstrates faint postcontrast enhancement
following contrast administration (series 2, image 11). Otherwise,
appearance of the cervical spinal cord is normal with no other new
lesions identified. Normal cord caliber and morphology. No other
abnormal enhancement.

Posterior Fossa, vertebral arteries, paraspinal tissues:
Unremarkable.

Disc levels: C5-6: Central disc protrusion indents the ventral
thecal sac (series 6, image 27). Minimal flattening of the ventral
cord without significant spinal stenosis. Foramina remain patent.

C6-7: Small central disc protrusion indents the ventral thecal sac
(series 6, image 31). No significant spinal stenosis or cord
deformity. Foramina remain patent.

No other significant spondylosis seen elsewhere within the cervical
spine. No other canal or foraminal stenosis.

MRI THORACIC SPINE FINDINGS

Alignment: Examination degraded by motion. Physiologic with
preservation of the normal thoracic kyphosis. No listhesis.

Vertebrae: Vertebral body height maintained without acute or chronic
fracture. Bone marrow signal intensity diffusely decreased on T1
weighted sequence, nonspecific, but most commonly related to anemia,
smoking or obesity. Benign hemangioma noted within the T1 vertebral
body. No worrisome osseous lesions. No abnormal marrow edema or
enhancement.

Cord: Normal signal and morphology. No visible cord signal lesions
to suggest demyelinating disease. No abnormal enhancement.

Posterior Fossa, vertebral arteries, paraspinal tissues:
Unremarkable.

Disc levels: Unremarkable. No significant disc pathology. No
stenosis or neural impingement.
IMPRESSION: MRI HEAD IMPRESSION:

Scattered cerebral white matter changes compatible with history of
multiple sclerosis. Interval contraction of a previously seen active
lesion involving the right periatrial white matter. Otherwise,
overall appearance is little interval changed without evidence for
significant disease progression. No evidence for active
demyelination within the brain.

MRI CERVICAL SPINE IMPRESSION:

1. Focal cord lesion involving the left hemi cord at C2-3, stable.
This lesion demonstrates faint postcontrast enhancement, suggesting
active demyelination.
2. Otherwise normal appearance of the cervical spinal cord, with no
other new lesions identified.
3. Small central disc protrusions at C5-6 and C6-7 without
significant stenosis.

MRI THORACIC SPINE IMPRESSION:

1. Stable and normal MRI appearance of the thoracic spinal cord with
no evidence for demyelinating disease.
2. No significant disc pathology or stenosis within the thoracic
spine.

## 2022-01-30 IMAGING — MR MR CERVICAL SPINE WO/W CM
4 of 9 series · 15 of 48 positions shown · IV contrast (cc GAD)
Comparison: Comparison made with previous MRIs from [DATE] and
[DATE].

CLINICAL DATA: Follow-up examination for multiple sclerosis, no new
symptoms.

EXAM:
MRI HEAD WITHOUT AND WITH CONTRAST
MRI CERVICAL SPINE WITHOUT AND WITH CONTRAST
MRI THORACIC SPINE WITHOUT AND WITH CONTRAST
TECHNIQUE: Multiplanar, multiecho pulse sequences of the brain and surrounding
structures, and cervical spine, to include the craniocervical
junction and cervicothoracic junction, and thoracic spine were
obtained without and with intravenous contrast.
CONTRAST:  10mL GADAVIST GADOBUTROL 1 MMOL/ML IV SOLN

[Series 2: T1 fat-sat post-contrast · sagittal · 3.0mm · 0.43mm/px · 4 of 18 slices shown]
[im 1/18]
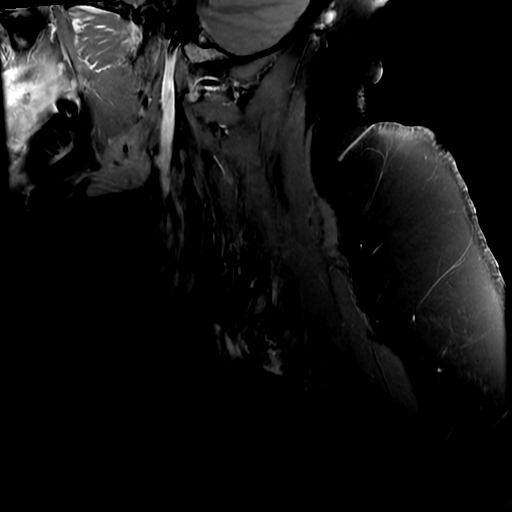
[im 6/18]
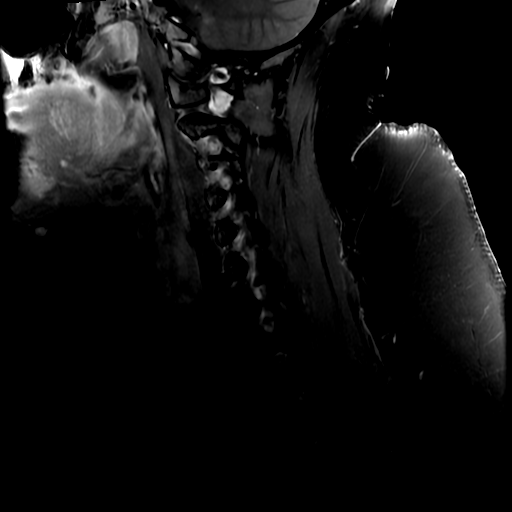
[im 12/18]
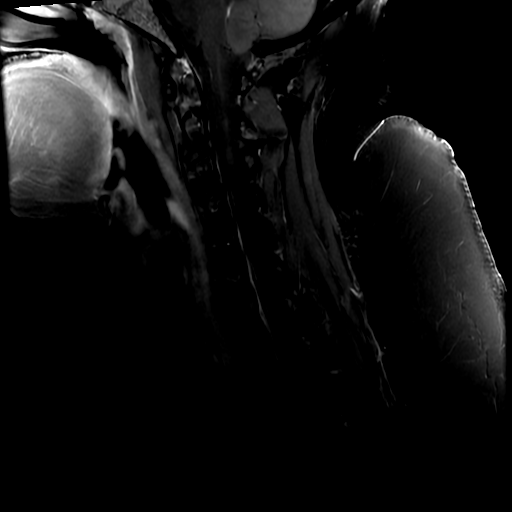
[im 18/18]
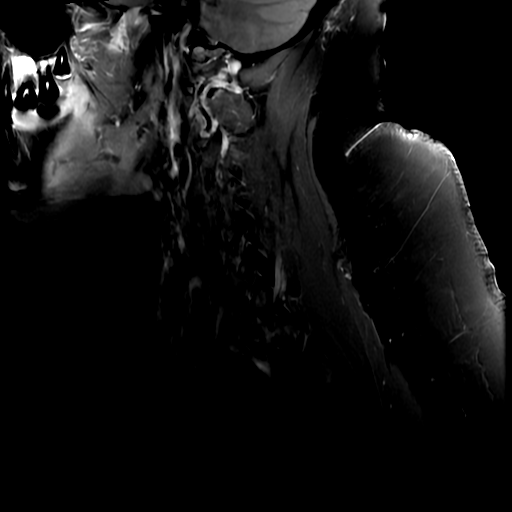

[Series 3: T2 · sagittal · 3.0mm · 0.43mm/px · 2 of 17 slices shown (1 of 2)]
[im 1/17]
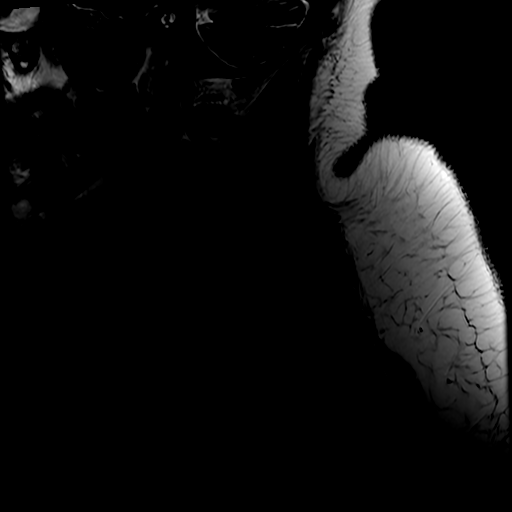
[im 17/17]
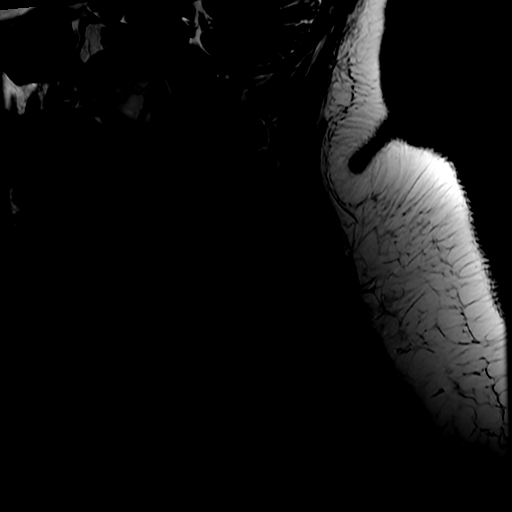

[Series 6: T2 · axial · 3.0mm · 0.35mm/px · z∈[-203,-71]mm · 6 of 40 slices shown (2 of 2)]
[im 1/40]
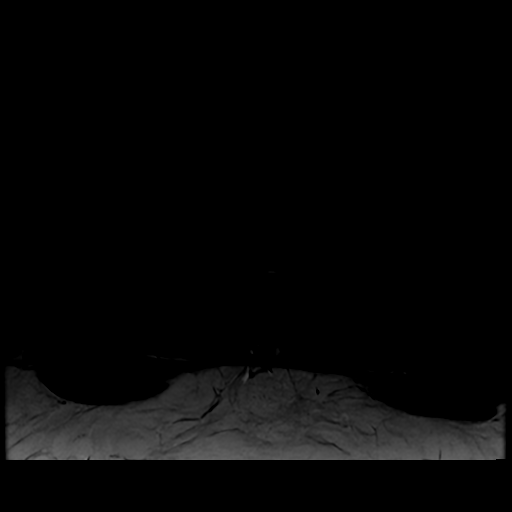
[im 8/40]
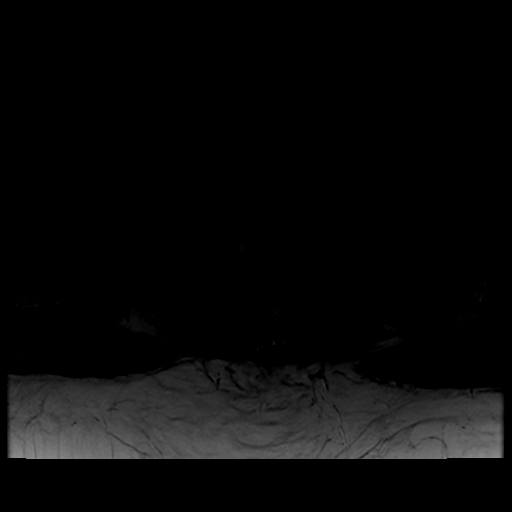
[im 16/40]
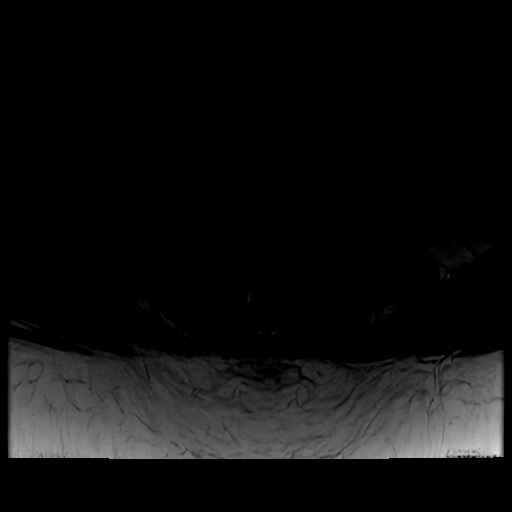
[im 24/40]
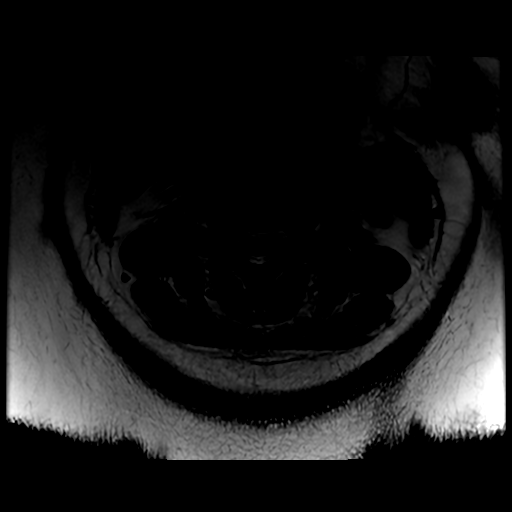
[im 32/40]
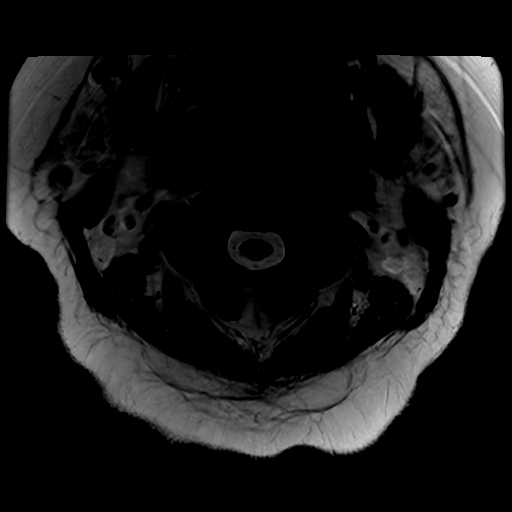
[im 40/40]
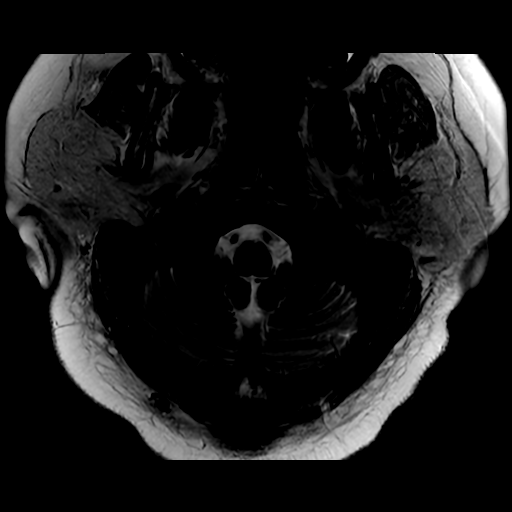

[Series 7: T1 · axial · non-contrast · 3.0mm · 0.35mm/px · z∈[-180,-71]mm · 3 of 40 slices shown]
[im 8/40]
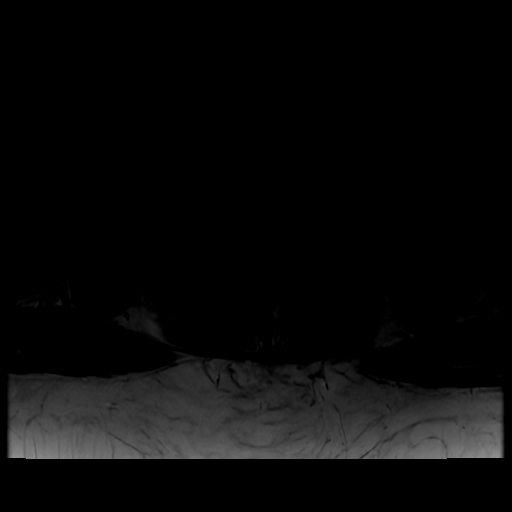
[im 24/40]
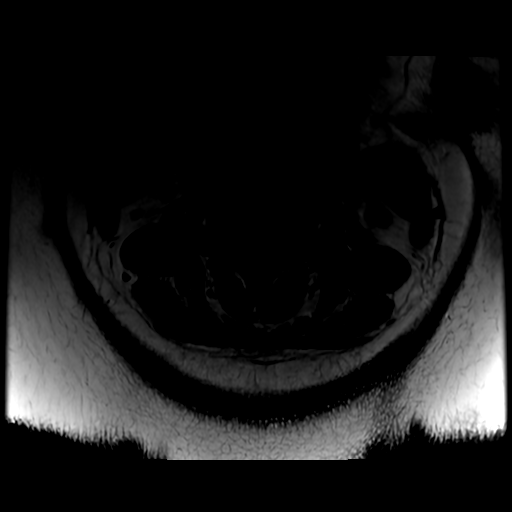
[im 40/40]
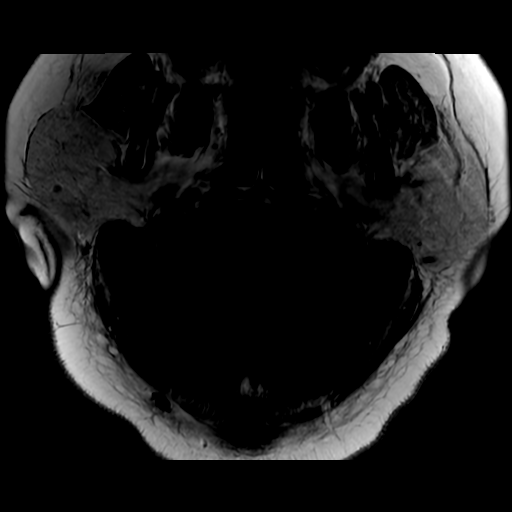

[15 of 48 positions shown; findings below may reference images not displayed]

FINDINGS: MRI HEAD FINDINGS

Brain: Examination degraded by motion.

Cerebral volume within normal limits. Again seen are scattered
patchy T2/FLAIR hyperintensities involving the periventricular,
deep, and juxta cortical white matter both cerebral hemispheres
consistent with provided history of multiple sclerosis. Overall,
number of these lesions appears fairly stable with no definite new
lesions identified. There has been interval contraction of a
previously seen active lesion at the right periatrial white matter,
now measuring 6 mm, previously 15 mm (series 7, image 20).
Otherwise, overall size and appearance of these lesions is likely
little interval changed given differences in technique. No
associated enhancement to suggest active demyelination.

No evidence for acute or subacute infarct. Gray-white matter
differentiation maintained. No encephalomalacia to suggest interval
cortical infarction. No acute or chronic intracranial blood
products.

No mass lesion, mass effect or midline shift. No hydrocephalus or
extra-axial fluid collection. Pituitary gland suprasellar region
within normal limits. Midline structures intact and normal. No other
abnormal enhancement.

Vascular: Major intracranial vascular flow voids are maintained.

Skull and upper cervical spine: Craniocervical junction within
normal limits. Bone marrow signal intensity diffusely decreased on
T1 weighted sequence, nonspecific, but most commonly related to
anemia, smoking or obesity. No focal marrow replacing lesion. No
scalp soft tissue abnormality.

Sinuses/Orbits: Globes orbital soft tissues demonstrate no acute
finding. Paranasal sinuses are clear. No mastoid effusion.

Other: None.

MRI CERVICAL SPINE FINDINGS

Alignment: Examination degraded by motion artifact.

Straightening of the normal cervical lordosis.  No listhesis.

Vertebrae: Vertebral body height maintained without acute or chronic
fracture. Bone marrow signal intensity diffusely decreased on T1
weighted sequence, nonspecific, but most commonly related to anemia,
smoking or obesity. Benign hemangioma noted within the T1 vertebral
body. No other discrete or worrisome osseous lesions. No abnormal
marrow edema or enhancement.

Cord: Again seen is a focal cord lesion involving the left hemi cord
at the level of C2-3 (series 4, image 11), relatively stable from
prior. This lesion demonstrates faint postcontrast enhancement
following contrast administration (series 2, image 11). Otherwise,
appearance of the cervical spinal cord is normal with no other new
lesions identified. Normal cord caliber and morphology. No other
abnormal enhancement.

Posterior Fossa, vertebral arteries, paraspinal tissues:
Unremarkable.

Disc levels: C5-6: Central disc protrusion indents the ventral
thecal sac (series 6, image 27). Minimal flattening of the ventral
cord without significant spinal stenosis. Foramina remain patent.

C6-7: Small central disc protrusion indents the ventral thecal sac
(series 6, image 31). No significant spinal stenosis or cord
deformity. Foramina remain patent.

No other significant spondylosis seen elsewhere within the cervical
spine. No other canal or foraminal stenosis.

MRI THORACIC SPINE FINDINGS

Alignment: Examination degraded by motion. Physiologic with
preservation of the normal thoracic kyphosis. No listhesis.

Vertebrae: Vertebral body height maintained without acute or chronic
fracture. Bone marrow signal intensity diffusely decreased on T1
weighted sequence, nonspecific, but most commonly related to anemia,
smoking or obesity. Benign hemangioma noted within the T1 vertebral
body. No worrisome osseous lesions. No abnormal marrow edema or
enhancement.

Cord: Normal signal and morphology. No visible cord signal lesions
to suggest demyelinating disease. No abnormal enhancement.

Posterior Fossa, vertebral arteries, paraspinal tissues:
Unremarkable.

Disc levels: Unremarkable. No significant disc pathology. No
stenosis or neural impingement.
IMPRESSION: MRI HEAD IMPRESSION:

Scattered cerebral white matter changes compatible with history of
multiple sclerosis. Interval contraction of a previously seen active
lesion involving the right periatrial white matter. Otherwise,
overall appearance is little interval changed without evidence for
significant disease progression. No evidence for active
demyelination within the brain.

MRI CERVICAL SPINE IMPRESSION:

1. Focal cord lesion involving the left hemi cord at C2-3, stable.
This lesion demonstrates faint postcontrast enhancement, suggesting
active demyelination.
2. Otherwise normal appearance of the cervical spinal cord, with no
other new lesions identified.
3. Small central disc protrusions at C5-6 and C6-7 without
significant stenosis.

MRI THORACIC SPINE IMPRESSION:

1. Stable and normal MRI appearance of the thoracic spinal cord with
no evidence for demyelinating disease.
2. No significant disc pathology or stenosis within the thoracic
spine.

## 2022-01-30 MED ORDER — VENLAFAXINE HCL ER 150 MG PO CP24: 150.0000 mg | ORAL_CAPSULE | Freq: Every day | ORAL | 0 refills | Status: DC

## 2022-01-30 MED ORDER — GADOBUTROL 1 MMOL/ML IV SOLN
10.0000 mL | Freq: Once | INTRAVENOUS | Status: AC | PRN
  Administered 2022-01-30: 10 mL via INTRAVENOUS

## 2022-01-30 NOTE — Telephone Encounter (Signed)
Per Dr.Jaffe, If she is currently taking venlafaxine XR 75mg  daily, then increase to 150mg  daily. ? ?Mychart message to patient. ?Per Patient please send in the increase.  ? ?Venlafaxine 150 mg daily sent to the [facility].  ?

## 2022-02-02 ENCOUNTER — Telehealth: Payer: Self-pay

## 2022-02-02 ENCOUNTER — Other Ambulatory Visit: Payer: Self-pay | Admitting: Pharmacy Technician

## 2022-02-02 ENCOUNTER — Telehealth: Payer: Self-pay | Admitting: Pharmacy Technician

## 2022-02-02 NOTE — Progress Notes (Signed)
LMOVM for pt to call the office back. Mychart message sent as well.

## 2022-02-02 NOTE — Telephone Encounter (Signed)
-----   Message from Drema Dallas, DO sent at 02/02/2022  7:14 AM EDT ----- ?MRIs show one spot in the spinal cord that is suggestive of an active MS plaque.  If she is feeling weakness in her arms and legs, we can set her up for 3 day course of IV steroids to calm things down (Solu-Medrol 1000mg  daily for 3 days).  Did she miss any doses of her Tysabri?  If not, I would like to discuss possibly changing medications at her follow up. ?

## 2022-02-02 NOTE — Telephone Encounter (Signed)
Dr. Everlena Cooper, ?Fyi note: ? ?Auth Submission: no auth needed ?Payer: [employer] ?Medication & CPT/J Code(s) submitted: Solumedrol (Methylprednisolone) J2930 ?Route of submission (phone, fax, portal): PHONE ?Auth type: Buy/Bill ?Units/visits requested: 100MG  QD X 3 DAYS ?Reference number: [id number] ?Approval from: 02/02/22 to 03/04/22  ? ?Patient will be scheduled as soon as possible ?

## 2022-02-02 NOTE — Telephone Encounter (Signed)
Per DR.jaffe if patient c/o weakness in her arm or legs,we can set her up for 3 day course of IV steroids to calm things down (Solu-Medrol 1000mg  daily for 3 days. ? ? ?Per pt her legs feel like jello. Patient fell the other due to the weakness in her legs. ? ?Per pt her Tysabri infusion is set up for every first Thursday of the month the last infusion 01/22/22.  ?

## 2022-02-02 NOTE — Telephone Encounter (Signed)
Spoke to the patient, myhcart message sent as well.  ?Order added.  ?

## 2022-02-04 ENCOUNTER — Ambulatory Visit (INDEPENDENT_AMBULATORY_CARE_PROVIDER_SITE_OTHER): Payer: 59

## 2022-02-04 VITALS — BP 154/83 | HR 103 | Temp 98.5°F | Resp 18 | Ht 64.0 in | Wt 365.4 lb

## 2022-02-04 DIAGNOSIS — G35 Multiple sclerosis: Secondary | ICD-10-CM

## 2022-02-04 MED ORDER — SODIUM CHLORIDE 0.9 % IV SOLN
1000.0000 mg | Freq: Once | INTRAVENOUS | Status: AC
  Administered 2022-02-04: 1000 mg via INTRAVENOUS
  Filled 2022-02-04: qty 16

## 2022-02-04 NOTE — Progress Notes (Signed)
Diagnosis: Multiple Sclerosis ? ?Provider:  Marshell Garfinkel, MD ? ?Procedure: Infusion ? ?IV Type: Peripheral, IV Location: L Antecubital ? ?Solumedrol (Methylprednisolone), Dose: 1000 mg ? ?Infusion Start Time: 1406 ? ?Infusion Stop Time: 1509 ? ?Post Infusion IV Care: Peripheral IV Discontinued ? ?Discharge: Condition: Good, Destination: Home . AVS provided to patient.  ? ?Performed by:  Aydrian Halpin, RN  ?  ?

## 2022-02-05 ENCOUNTER — Ambulatory Visit (INDEPENDENT_AMBULATORY_CARE_PROVIDER_SITE_OTHER): Payer: 59

## 2022-02-05 VITALS — BP 144/82 | HR 109 | Temp 98.3°F | Resp 18 | Ht 64.0 in | Wt 366.0 lb

## 2022-02-05 DIAGNOSIS — G35 Multiple sclerosis: Secondary | ICD-10-CM | POA: Diagnosis not present

## 2022-02-05 MED ORDER — SODIUM CHLORIDE 0.9 % IV SOLN
1000.0000 mg | Freq: Once | INTRAVENOUS | Status: AC
  Administered 2022-02-05: 1000 mg via INTRAVENOUS
  Filled 2022-02-05: qty 16

## 2022-02-05 NOTE — Progress Notes (Signed)
Diagnosis: Multiple Sclerosis ? ?Provider:  Chilton Greathouse, MD ? ?Procedure: Infusion ? ?IV Type: Peripheral, IV Location: L Antecubital ? ?Solumedrol (Methylprednisolone), Dose: 1000 mg ? ?Infusion Start Time: 1315 ? ?Infusion Stop Time: 1420 ? ?Post Infusion IV Care: Peripheral IV Discontinued ? ?Discharge: Condition: Good, Destination: Home . AVS provided to patient.  ? ?Performed by:  Weslee Fogg, RN  ?  ?

## 2022-02-06 ENCOUNTER — Ambulatory Visit (INDEPENDENT_AMBULATORY_CARE_PROVIDER_SITE_OTHER): Payer: 59

## 2022-02-06 VITALS — BP 143/82 | HR 87 | Temp 98.3°F | Resp 18 | Ht 64.0 in | Wt 363.6 lb

## 2022-02-06 DIAGNOSIS — G35 Multiple sclerosis: Secondary | ICD-10-CM

## 2022-02-06 MED ORDER — SODIUM CHLORIDE 0.9 % IV SOLN
1000.0000 mg | Freq: Once | INTRAVENOUS | Status: AC
  Administered 2022-02-06: 1000 mg via INTRAVENOUS
  Filled 2022-02-06 (×2): qty 16

## 2022-02-06 NOTE — Progress Notes (Signed)
Diagnosis: Multiple Sclerosis ? ?Provider:  Chilton Greathouse, MD ? ?Procedure: Infusion ? ?IV Type: Peripheral, IV Location: L Antecubital ? ?Solumedrol (Methylprednisolone), Dose: 1000 mg ? ?Infusion Start Time: [phone] ? ?Infusion Stop Time: 1056 ? ?Post Infusion IV Care: Peripheral IV Discontinued ? ?Discharge: Condition: Good, Destination: Home . AVS provided to patient.  ? ?Performed by:  Malcolm Quast, RN  ?  ?
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
## 2022-02-17 ENCOUNTER — Encounter: Payer: Self-pay | Admitting: Neurology

## 2022-02-17 ENCOUNTER — Other Ambulatory Visit: Payer: 59

## 2022-02-17 ENCOUNTER — Telehealth: Payer: Self-pay | Admitting: Pharmacy Technician

## 2022-02-17 ENCOUNTER — Ambulatory Visit (INDEPENDENT_AMBULATORY_CARE_PROVIDER_SITE_OTHER): Payer: 59 | Admitting: Neurology

## 2022-02-17 ENCOUNTER — Other Ambulatory Visit: Payer: Self-pay | Admitting: Neurology

## 2022-02-17 VITALS — BP 100/60 | HR 98 | Ht 62.0 in | Wt 362.0 lb

## 2022-02-17 DIAGNOSIS — G43109 Migraine with aura, not intractable, without status migrainosus: Secondary | ICD-10-CM | POA: Diagnosis not present

## 2022-02-17 DIAGNOSIS — G35 Multiple sclerosis: Secondary | ICD-10-CM | POA: Diagnosis not present

## 2022-02-17 LAB — VITAMIN D 25 HYDROXY (VIT D DEFICIENCY, FRACTURES): VITD: 17.79 ng/mL — ABNORMAL LOW (ref 30.00–100.00)

## 2022-02-17 MED ORDER — AIMOVIG 140 MG/ML ~~LOC~~ SOAJ: 140.0000 mg | SUBCUTANEOUS | 5 refills | Status: DC

## 2022-02-17 MED ORDER — GABAPENTIN 600 MG PO TABS: 600.0000 mg | ORAL_TABLET | Freq: Three times a day (TID) | ORAL | 5 refills | Status: DC

## 2022-02-17 NOTE — Patient Instructions (Addendum)
Stop Tysabri.  Start Ocrevus ?Continue D3 50,000 IU weekly ?Increase gabapentin to 600mg  three times daily ?Continue venlafaxine XR 150mg  daily.  Start Aimovig 140mg  every 30 days ?Rizatriptan for migraine attacks ?Check CBC with diff, hepatitis B panel, immunoglobulin panel, and vit D ?Follow up 6 months. ?

## 2022-02-17 NOTE — Telephone Encounter (Addendum)
Dr. Everlena Cooper, ?Fyi note: ? ?Auth Submission: APPROVED ?Payer: [employer] ?Medication & CPT/J Code(s) submitted: Ocrevus [employer]) [id number] ?Route of submission (phone, fax, portal): PORTAL ?Auth type: Buy/Bill ?Units/visits requested: 2 DOSES ?Reference number: [id number] ?Approval from:  02/17/22 - 08/20/22 ?Patient will be scheduled as soon as possible. ? ?Ocrevus co-pay card: APPROVED ?Phone: [phone] ?Fax claims: [phone] ?ID: [id number]  ?GR: [id number] ?BIN: [id number] ?PNC: 54 ?PAYER ID: [id number] ? ?Credit card: [phone] ?Exp: 5/27 ?Cvv: 526 ? ?

## 2022-02-17 NOTE — Addendum Note (Signed)
Addended by: Leida Lauth on: 02/17/2022 11:05 AM ? ? Modules accepted: Orders ? ?

## 2022-02-18 ENCOUNTER — Encounter: Payer: Self-pay | Admitting: Neurology

## 2022-02-18 LAB — CBC WITH DIFFERENTIAL
Basophils Absolute: 0 10*3/uL (ref 0.0–0.2)
Basos: 0 %
EOS (ABSOLUTE): 0.3 10*3/uL (ref 0.0–0.4)
Eos: 3 %
Hematocrit: 32.8 % — ABNORMAL LOW (ref 34.0–46.6)
Hemoglobin: 10.4 g/dL — ABNORMAL LOW (ref 11.1–15.9)
Immature Grans (Abs): 0.2 10*3/uL — ABNORMAL HIGH (ref 0.0–0.1)
Immature Granulocytes: 2 %
Lymphocytes Absolute: 3.8 10*3/uL — ABNORMAL HIGH (ref 0.7–3.1)
Lymphs: 37 %
MCH: 22.7 pg — ABNORMAL LOW (ref 26.6–33.0)
MCHC: 31.7 g/dL (ref 31.5–35.7)
MCV: 72 fL — ABNORMAL LOW (ref 79–97)
Monocytes Absolute: 0.8 10*3/uL (ref 0.1–0.9)
Monocytes: 8 %
NRBC: 1 % — ABNORMAL HIGH (ref 0–0)
Neutrophils Absolute: 5.1 10*3/uL (ref 1.4–7.0)
Neutrophils: 50 %
RBC: 4.59 x10E6/uL (ref 3.77–5.28)
RDW: 21.6 % — ABNORMAL HIGH (ref 11.7–15.4)
WBC: 10.2 10*3/uL (ref 3.4–10.8)

## 2022-02-18 LAB — IGG, IGA, IGM
IgG (Immunoglobin G), Serum: 779 mg/dL (ref 600–1640)
IgM, Serum: 97 mg/dL (ref 50–300)
Immunoglobulin A: 448 mg/dL — ABNORMAL HIGH (ref 47–310)

## 2022-02-18 LAB — ACUTE HEP PANEL AND HEP B SURFACE AB
HEPATITIS C ANTIBODY REFILL$(REFL): NONREACTIVE
Hep A IgM: NONREACTIVE
Hep B C IgM: NONREACTIVE
Hepatitis B Surface Ag: NONREACTIVE
SIGNAL TO CUT-OFF: 0.08 (ref <1.00)

## 2022-02-18 LAB — REFLEX TIQ

## 2022-02-19 ENCOUNTER — Ambulatory Visit: Payer: 59

## 2022-02-19 ENCOUNTER — Other Ambulatory Visit (HOSPITAL_COMMUNITY): Payer: Self-pay

## 2022-02-19 ENCOUNTER — Encounter: Payer: Self-pay | Admitting: Neurology

## 2022-02-19 ENCOUNTER — Telehealth: Payer: Self-pay | Admitting: Pharmacy Technician

## 2022-02-19 NOTE — Progress Notes (Signed)
LMOVM to call the office back.

## 2022-02-19 NOTE — Telephone Encounter (Signed)
Pa submitted 02/19/22

## 2022-02-19 NOTE — Telephone Encounter (Signed)
Patient Advocate Encounter ?  ?Received notification that prior authorization for Aimovig 140MG /ML auto-injectors is required. ?  ?PA submitted on 02/19/2022 ?Key [id number] ?Status is pending ?   ? ? ?04/21/2022, CPhT-Adv ?Pharmacy Patient Advocate Specialist ?[facility] Patient Advocate Team ?Direct Number: [phone]  Fax: [phone] ? ? ?

## 2022-02-20 NOTE — Telephone Encounter (Signed)
Patient Advocate Encounter ? ?Prior Authorization for Aimovig 140MG /ML auto-injectors has been approved.   ? ?PA# ?Effective dates: 02/20/2022 through 08/22/2022 ? ? ? ? ? ?13/01/2022, CPhT ?Pharmacy Patient Advocate Specialist ?[facility] Patient Advocate Team ?Direct Number: [phone]  Fax: [phone]  ?

## 2022-02-24 ENCOUNTER — Other Ambulatory Visit: Payer: Self-pay

## 2022-02-24 DIAGNOSIS — G35 Multiple sclerosis: Secondary | ICD-10-CM

## 2022-02-24 NOTE — Progress Notes (Signed)
Per Dr.Jaffe, Please have patient labs checked for Quantiferon TB GOLD. ? ? ?LMOVM for patient call the office so we can go over what Dr.Jaffe added.  ?

## 2022-02-26 ENCOUNTER — Ambulatory Visit (INDEPENDENT_AMBULATORY_CARE_PROVIDER_SITE_OTHER): Payer: 59

## 2022-02-26 VITALS — BP 144/76 | HR 108 | Temp 98.8°F | Resp 16 | Ht 64.0 in | Wt 361.0 lb

## 2022-02-26 DIAGNOSIS — G35 Multiple sclerosis: Secondary | ICD-10-CM

## 2022-02-26 MED ORDER — DIPHENHYDRAMINE HCL 25 MG PO CAPS
50.0000 mg | ORAL_CAPSULE | Freq: Once | ORAL | Status: AC
  Administered 2022-02-26: 50 mg via ORAL
  Filled 2022-02-26: qty 2

## 2022-02-26 MED ORDER — SODIUM CHLORIDE 0.9 % IV SOLN
300.0000 mg | Freq: Once | INTRAVENOUS | Status: AC
  Administered 2022-02-26: 300 mg via INTRAVENOUS
  Filled 2022-02-26: qty 10

## 2022-02-26 MED ORDER — METHYLPREDNISOLONE SODIUM SUCC 125 MG IJ SOLR
125.0000 mg | Freq: Once | INTRAMUSCULAR | Status: AC
  Administered 2022-02-26: 125 mg via INTRAVENOUS
  Filled 2022-02-26: qty 2

## 2022-02-26 MED ORDER — ACETAMINOPHEN 325 MG PO TABS
650.0000 mg | ORAL_TABLET | Freq: Once | ORAL | Status: AC
  Administered 2022-02-26: 650 mg via ORAL
  Filled 2022-02-26: qty 2

## 2022-02-26 NOTE — Progress Notes (Signed)
Diagnosis: Multiple Sclerosis ? ?Provider:  Chilton Greathouse, MD ? ?Procedure: Infusion ? ?IV Type: Peripheral, IV Location: L Antecubital ? ?Ocrevus (Ocrelizumab), Dose: 300 mg ? ?Infusion Start Time: [phone] ? ?Infusion Stop Time: 1246 ? ?Post Infusion IV Care: Observation period completed and Peripheral IV Discontinued ? ?Discharge: Condition: Good, Destination: Home . AVS provided to patient.  ? ?Performed by:  Loney Hering, LPN  ?  ?

## 2022-03-06 ENCOUNTER — Telehealth: Payer: Self-pay | Admitting: Neurology

## 2022-03-06 NOTE — Telephone Encounter (Signed)
Michelle Velasquez with [employer] called regarding a form that was not completed. There are 6 responses that were missed. She will be faxing it back over to be filled out correctly. Even though pt is dismissed, it has to be filled out. Said fax it over once completed. No number was given, just said fax it

## 2022-03-09 NOTE — Telephone Encounter (Signed)
Form filled out and re sent to [employer].

## 2022-03-10 ENCOUNTER — Telehealth: Payer: Self-pay | Admitting: Pharmacy Technician

## 2022-03-10 NOTE — Telephone Encounter (Signed)
Michelle Velasquez,  I received a fax stating she is no longer authorized to receive tysabri at our facility. Is the patient no longer receiving tysabri or has she decided to change facilty?  Please advise. Kim  Fyi: I have scanned the letter to her media tab for review?

## 2022-03-10 NOTE — Telephone Encounter (Signed)
Patient on Ocrevus now as of 02/17/22.

## 2022-03-10 NOTE — Telephone Encounter (Signed)
Ok, ty

## 2022-03-11 ENCOUNTER — Other Ambulatory Visit: Payer: Self-pay | Admitting: Neurology

## 2022-03-12 ENCOUNTER — Encounter: Payer: Self-pay | Admitting: Nurse Practitioner

## 2022-03-17 ENCOUNTER — Encounter: Payer: Self-pay | Admitting: Neurology

## 2022-03-18 ENCOUNTER — Other Ambulatory Visit: Payer: Self-pay | Admitting: Neurology

## 2022-03-18 ENCOUNTER — Encounter: Payer: Self-pay | Admitting: Neurology

## 2022-03-18 MED ORDER — ONDANSETRON HCL 4 MG PO TABS: 4.0000 mg | ORAL_TABLET | Freq: Three times a day (TID) | ORAL | 5 refills | Status: DC | PRN

## 2022-03-19 ENCOUNTER — Ambulatory Visit (INDEPENDENT_AMBULATORY_CARE_PROVIDER_SITE_OTHER): Payer: 59

## 2022-03-19 ENCOUNTER — Encounter: Payer: Self-pay | Admitting: Neurology

## 2022-03-19 ENCOUNTER — Ambulatory Visit: Payer: 59

## 2022-03-19 VITALS — BP 141/83 | HR 110 | Temp 98.3°F | Resp 16 | Ht 64.0 in | Wt 361.6 lb

## 2022-03-19 DIAGNOSIS — G35 Multiple sclerosis: Secondary | ICD-10-CM | POA: Diagnosis not present

## 2022-03-19 MED ORDER — SODIUM CHLORIDE 0.9 % IV SOLN
300.0000 mg | Freq: Once | INTRAVENOUS | Status: AC
  Administered 2022-03-19: 300 mg via INTRAVENOUS
  Filled 2022-03-19: qty 10

## 2022-03-19 MED ORDER — DIPHENHYDRAMINE HCL 25 MG PO CAPS
50.0000 mg | ORAL_CAPSULE | Freq: Once | ORAL | Status: AC
  Administered 2022-03-19: 50 mg via ORAL
  Filled 2022-03-19: qty 2

## 2022-03-19 MED ORDER — METHYLPREDNISOLONE SODIUM SUCC 125 MG IJ SOLR
125.0000 mg | Freq: Once | INTRAMUSCULAR | Status: AC
  Administered 2022-03-19: 125 mg via INTRAVENOUS
  Filled 2022-03-19: qty 2

## 2022-03-19 MED ORDER — ACETAMINOPHEN 325 MG PO TABS
650.0000 mg | ORAL_TABLET | Freq: Once | ORAL | Status: AC
  Administered 2022-03-19: 650 mg via ORAL
  Filled 2022-03-19: qty 2

## 2022-03-19 NOTE — Progress Notes (Signed)
Diagnosis: Multiple Sclerosis  Provider:  Chilton Greathouse, MD  Procedure: Infusion  IV Type: Peripheral, IV Location: L Antecubital  Ocrevus (Ocrelizumab), Dose: 300 mg  Infusion Start Time: 1001  Infusion Stop Time: 1225  Post Infusion IV Care: Observation period completed and Peripheral IV Discontinued  Discharge: Condition: Good, Destination: Home . AVS provided to patient.   Performed by:  Loney Hering, LPN

## 2022-04-14 ENCOUNTER — Other Ambulatory Visit: Payer: Self-pay | Admitting: Neurology

## 2022-06-10 ENCOUNTER — Encounter: Payer: Self-pay | Admitting: Neurology

## 2022-07-24 ENCOUNTER — Telehealth: Payer: Self-pay | Admitting: Pharmacy Technician

## 2022-07-24 NOTE — Telephone Encounter (Signed)
Patient Advocate Encounter   Received notification that prior authorization for Aimovig 140MG /ML auto-injectors is required.   PA submitted on 07/24/2022 Key [id number] Status is pending       Lyndel Safe, [place] Patient Advocate Specialist [place] Patient Advocate Team Direct Number: [phone]  Fax: [phone]

## 2022-07-27 NOTE — Telephone Encounter (Signed)
Patient Advocate Encounter  Prior Authorization for Aimovig 140MG /ML auto-injectors  has been approved.    Effective: 07-25-2022 to 07-25-2023

## 2022-08-13 ENCOUNTER — Encounter: Payer: Self-pay | Admitting: Neurology

## 2022-08-20 ENCOUNTER — Other Ambulatory Visit (INDEPENDENT_AMBULATORY_CARE_PROVIDER_SITE_OTHER): Payer: 59

## 2022-08-20 ENCOUNTER — Ambulatory Visit (INDEPENDENT_AMBULATORY_CARE_PROVIDER_SITE_OTHER): Payer: 59 | Admitting: Neurology

## 2022-08-20 VITALS — BP 140/79 | HR 86 | Ht 60.0 in | Wt 350.0 lb

## 2022-08-20 DIAGNOSIS — G35 Multiple sclerosis: Secondary | ICD-10-CM | POA: Diagnosis not present

## 2022-08-20 DIAGNOSIS — G43109 Migraine with aura, not intractable, without status migrainosus: Secondary | ICD-10-CM | POA: Diagnosis not present

## 2022-08-20 DIAGNOSIS — R2681 Unsteadiness on feet: Secondary | ICD-10-CM | POA: Diagnosis not present

## 2022-08-20 LAB — VITAMIN D 25 HYDROXY (VIT D DEFICIENCY, FRACTURES): VITD: 23.42 ng/mL — ABNORMAL LOW (ref 30.00–100.00)

## 2022-08-20 MED ORDER — BACLOFEN 10 MG PO TABS: 10.0000 mg | ORAL_TABLET | Freq: Three times a day (TID) | ORAL | 5 refills | Status: DC | PRN

## 2022-08-20 MED ORDER — RIZATRIPTAN BENZOATE 10 MG PO TBDP
10.0000 mg | ORAL_TABLET | ORAL | 11 refills | Status: DC | PRN
  Filled 2023-02-11: qty 10, 5d supply, fill #0
  Filled 2023-05-26: qty 10, 5d supply, fill #1
  Filled 2023-06-24: qty 10, 5d supply, fill #2
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
## 2022-08-20 NOTE — Patient Instructions (Addendum)
Start baclofen 10mg  three times daily as needed for back/neck pain. Continue gabapentin 600mg  three times daily Continue Ocrevus Continue Aimovig  Continue rizatriptan and zofran as needed Continue D3 8000 IU daily Check labs today and in 6 months. Repeat MRI of brain and cervical spine with and without contrast in 6 months Physical therapy for gait instability.  Use cane if needed. Follow up with me in 6 months.

## 2022-08-21 ENCOUNTER — Encounter: Payer: Self-pay | Admitting: Neurology

## 2022-08-21 LAB — CBC WITH DIFFERENTIAL
Basophils Absolute: 0 10*3/uL (ref 0.0–0.2)
Basos: 1 %
EOS (ABSOLUTE): 0.2 10*3/uL (ref 0.0–0.4)
Eos: 4 %
Hematocrit: 32.2 % — ABNORMAL LOW (ref 34.0–46.6)
Hemoglobin: 9.5 g/dL — ABNORMAL LOW (ref 11.1–15.9)
Immature Grans (Abs): 0 10*3/uL (ref 0.0–0.1)
Immature Granulocytes: 1 %
Lymphocytes Absolute: 1.9 10*3/uL (ref 0.7–3.1)
Lymphs: 31 %
MCH: 20.8 pg — ABNORMAL LOW (ref 26.6–33.0)
MCHC: 29.5 g/dL — ABNORMAL LOW (ref 31.5–35.7)
MCV: 71 fL — ABNORMAL LOW (ref 79–97)
Monocytes Absolute: 0.4 10*3/uL (ref 0.1–0.9)
Monocytes: 6 %
Neutrophils Absolute: 3.8 10*3/uL (ref 1.4–7.0)
Neutrophils: 57 %
RBC: 4.56 x10E6/uL (ref 3.77–5.28)
RDW: 20.8 % — ABNORMAL HIGH (ref 11.7–15.4)
WBC: 6.4 10*3/uL (ref 3.4–10.8)

## 2022-08-21 LAB — IGG, IGA, IGM
IgG (Immunoglobin G), Serum: 984 mg/dL (ref 600–1640)
IgM, Serum: 89 mg/dL (ref 50–300)
Immunoglobulin A: 553 mg/dL — ABNORMAL HIGH (ref 47–310)

## 2022-08-21 LAB — QUANTIFERON-TB GOLD PLUS

## 2022-08-24 ENCOUNTER — Encounter: Payer: Self-pay | Admitting: Neurology

## 2022-08-26 NOTE — Progress Notes (Signed)
LMOVM for patient to call the office.

## 2022-08-27 ENCOUNTER — Other Ambulatory Visit: Payer: Self-pay | Admitting: Pharmacy Technician

## 2022-08-27 ENCOUNTER — Telehealth: Payer: Self-pay | Admitting: Pharmacy Technician

## 2022-08-27 NOTE — Telephone Encounter (Signed)
Auth Submission: APPROVED [place] RENEWAL Payer: [employer] Medication & CPT/J Code(s) submitted: Emogene Morgan [employer]) [phone] Route of submission (phone, fax, portal): PORATL Phone # Fax # Auth type: Buy/Bill Units/visits requested: X2 DOSES Reference number: [id number] Approval from: 08/27/22 to 08/28/23

## 2022-09-04 ENCOUNTER — Emergency Department (HOSPITAL_BASED_OUTPATIENT_CLINIC_OR_DEPARTMENT_OTHER)
Admission: EM | Admit: 2022-09-04 | Discharge: 2022-09-04 | Disposition: A | Discharge status: Home / Self Care | Payer: 59 | Attending: Emergency Medicine | Admitting: Emergency Medicine

## 2022-09-04 ENCOUNTER — Encounter (HOSPITAL_BASED_OUTPATIENT_CLINIC_OR_DEPARTMENT_OTHER): Payer: Self-pay

## 2022-09-04 DIAGNOSIS — R102 Pelvic and perineal pain: Secondary | ICD-10-CM | POA: Insufficient documentation

## 2022-09-04 DIAGNOSIS — R300 Dysuria: Secondary | ICD-10-CM | POA: Insufficient documentation

## 2022-09-04 DIAGNOSIS — N390 Urinary tract infection, site not specified: Secondary | ICD-10-CM | POA: Diagnosis not present

## 2022-09-04 DIAGNOSIS — S3141XA Laceration without foreign body of vagina and vulva, initial encounter: Secondary | ICD-10-CM

## 2022-09-04 LAB — URINALYSIS, ROUTINE W REFLEX MICROSCOPIC
Bilirubin Urine: NEGATIVE
Glucose, UA: NEGATIVE mg/dL
Hgb urine dipstick: NEGATIVE
Ketones, ur: NEGATIVE mg/dL
Nitrite: NEGATIVE
Protein, ur: 30 mg/dL — AB
Specific Gravity, Urine: 1.033 — ABNORMAL HIGH (ref 1.005–1.030)
pH: 6.5 (ref 5.0–8.0)

## 2022-09-04 LAB — WET PREP, GENITAL
Clue Cells Wet Prep HPF POC: NONE SEEN
Sperm: NONE SEEN
Trich, Wet Prep: NONE SEEN
WBC, Wet Prep HPF POC: >=10 — AB (ref <10)
Yeast Wet Prep HPF POC: NONE SEEN

## 2022-09-04 LAB — PREGNANCY, URINE: Preg Test, Ur: NEGATIVE

## 2022-09-04 MED ORDER — CEPHALEXIN 250 MG PO CAPS
500.0000 mg | ORAL_CAPSULE | Freq: Once | ORAL | Status: AC
  Administered 2022-09-04: 500 mg via ORAL
  Filled 2022-09-04: qty 2

## 2022-09-04 MED ORDER — KETOROLAC TROMETHAMINE 30 MG/ML IJ SOLN
60.0000 mg | Freq: Once | INTRAMUSCULAR | Status: AC
  Administered 2022-09-04: 60 mg via INTRAMUSCULAR
  Filled 2022-09-04: qty 2

## 2022-09-04 MED ORDER — PHENAZOPYRIDINE HCL 200 MG PO TABS: 200.0000 mg | ORAL_TABLET | Freq: Three times a day (TID) | ORAL | 0 refills | Status: DC

## 2022-09-04 MED ORDER — CEPHALEXIN 500 MG PO CAPS: 500.0000 mg | ORAL_CAPSULE | Freq: Two times a day (BID) | ORAL | 0 refills | Status: AC

## 2022-09-04 NOTE — ED Provider Notes (Signed)
[facility] EMERGENCY DEPT Provider Note   CSN: [id number] Arrival date & time: 09/04/22  1733     History  Chief Complaint  Patient presents with   vaginal tear    Michelle Velasquez is a 31 y.o. female.  With PMH of obesity, MS, depression, PCOS who presents with vaginal pain and burning that has been worsening over the past week but has been constant over the past month.  She has PCOS and known abnormal periods.  She just finished a 78-month long period pretty recently over the past week.  However she noticed some still mild bleeding, from her vagina that she did not think was consistent with spotting or.  Bleeding.  Her partner looked at her vagina and noticed a tear and was concerned so brought her to the ER for further evaluation.  They have not had any intercourse or sexual relations for the past year due to her PCOS issues.  She has had a lot of burning dysuria pain and thought she may have UTI and took some Azo with some relief but then it returned after stopping Azo.  She has had no fevers, no abnormal vaginal discharge, no vomiting, no diarrhea, no abdominal pain or pelvic pain.  The pain is all focused at her vagina.  She does not have a OB/GYN who she currently follows with.  She stopped using tampons about 3 months ago.  She has just been using pads.  There has been no manipulation of the site, no penetration, no injury that she was aware of, no sex toys.  HPI     Home Medications Prior to Admission medications   Medication Sig Start Date End Date Taking? Authorizing Provider  cephALEXin (KEFLEX) 500 MG capsule Take 1 capsule (500 mg total) by mouth 2 (two) times daily for 7 days. 09/04/22 09/11/22 Yes Mardene Sayer, MD  phenazopyridine (PYRIDIUM) 200 MG tablet Take 1 tablet (200 mg total) by mouth 3 (three) times daily. 09/04/22  Yes Mardene Sayer, MD  albuterol (VENTOLIN HFA) 108 (90 Base) MCG/ACT inhaler Inhale 2 puffs into the lungs every 6 (six)  hours as needed for wheezing or shortness of breath (Cough). 01/11/22   Theadora Rama Scales, PA-C  baclofen (LIORESAL) 10 MG tablet Take 1 tablet (10 mg total) by mouth 3 (three) times daily as needed for muscle spasms. 08/20/22   Drema Dallas, DO  cetirizine (ZYRTEC ALLERGY) 10 MG tablet Take 1 tablet (10 mg total) by mouth at bedtime. 01/11/22 07/10/22  Theadora Rama Scales, PA-C  Cholecalciferol (VITAMIN D3) 1.25 MG (50000 UT) CAPS TAKE 1 CAPSULE BY MOUTH EVERY 7 DAYS 02/17/22   Everlena Cooper, Rachelle Hora, DO  Erenumab-aooe (AIMOVIG) 140 MG/ML SOAJ Inject 140 mg into the skin every 30 (thirty) days. 02/17/22   Everlena Cooper, Adam R, DO  gabapentin (NEURONTIN) 600 MG tablet Take 1 tablet (600 mg total) by mouth 3 (three) times daily. 02/17/22   Everlena Cooper, Adam R, DO  ondansetron (ZOFRAN) 4 MG tablet Take 1-2 tablets (4-8 mg total) by mouth every 8 (eight) hours as needed. 03/18/22   Drema Dallas, DO  rizatriptan (MAXALT-MLT) 10 MG disintegrating tablet Take 1 tablet (10 mg total) by mouth as needed for migraine. May repeat in 2 hours if needed.  Maximum 2 tablets in 24 hours. 08/20/22   Drema Dallas, DO  venlafaxine XR (EFFEXOR-XR) 150 MG 24 hr capsule TAKE 1 CAPSULE(150 MG) BY MOUTH DAILY WITH BREAKFAST 04/15/22   Drema Dallas, DO  Allergies    Sulfa antibiotics, Gadolinium derivatives, and Grass pollen(k-o-r-t-swt vern)    Review of Systems   Review of Systems  Physical Exam Updated Vital Signs BP (!) 137/104   Pulse 97   Temp 98.1 F (36.7 C)   Resp 17   Ht 5\' 4"  (1.626 m)   Wt (!) 169.6 kg   LMP 08/23/2022 (Approximate)   SpO2 100%   BMI 64.20 kg/m  Physical Exam Constitutional: Alert and oriented.  Slightly uncomfortable but no acute distress Eyes: Conjunctivae are normal. ENT      Head: Normocephalic and atraumatic. Cardiovascular: Regular rate Respiratory: Normal respiratory effort.  Gastrointestinal: Soft and nontender. There is no CVA tenderness. GU: Pelvic exam performed with chaperone at  bedside.  She had normal external genitalia with no concerning lesions.  There was a small hemostatic tear 0.5 cm at the left 3 o'clock position of the superior labia minora with no involvement of clitoris or urethra.  Physiologic discharge.  No CMT.  Cervix nonerythematous no lesions. Musculoskeletal: Normal range of motion in all extremities. Neurologic: Normal speech and language. No gross focal neurologic deficits are appreciated. Skin: Skin is warm, dry and intact. No rash noted. Psychiatric: Mood and affect are normal. Speech and behavior are normal.  ED Results / Procedures / Treatments   Labs (all labs ordered are listed, but only abnormal results are displayed) Labs Reviewed  WET PREP, GENITAL - Abnormal; Notable for the following components:      Result Value   WBC, Wet Prep HPF POC >=10 (*)    All other components within normal limits  URINALYSIS, ROUTINE W REFLEX MICROSCOPIC - Abnormal; Notable for the following components:   Specific Gravity, Urine 1.033 (*)    Protein, ur 30 (*)    Leukocytes,Ua MODERATE (*)    All other components within normal limits  URINE CULTURE  PREGNANCY, URINE  GC/CHLAMYDIA PROBE AMP ([place]) NOT AT [facility]    EKG None  Radiology No results found.  Procedures Procedures    Medications Ordered in ED Medications  ketorolac (TORADOL) 30 MG/ML injection 60 mg (has no administration in time range)  cephALEXin (KEFLEX) capsule 500 mg (has no administration in time range)    ED Course/ Medical Decision Making/ A&P                           Medical Decision Making Michelle Velasquez is a 31 y.o. female.  With PMH of obesity, MS, depression, PCOS who presents with vaginal pain and burning that has been worsening over the past week but has been constant over the past month.    Patient's pain is likely due to small 0.5 cm hemostatic tear visualized on labia minora as documented in physical exam as well as UTI.  Tear likely  traumatic from  recent prolonged menstrual period and possible repeated wiping and tampon use.  Does not require repair.   Is not consistent with STI additionally she has not been sexually active for the past year.  Basic STI swabs were sent just due to location although no concern as discussed and no CMT or concerning discharge.  UA was obtained to further evaluate for UTI which showed moderate leukocyte esterase 6-10 WBCs negative nitrite.  Pregnancy test negative.  Advised continued supportive care with sitz bath, Tylenol and ibuprofen for pain control, discharged with Keflex and phenazopyridine for UTI.  Made referral to OB/GYN and provided information for follow-up.  Discussed  strict return precautions.  She is safe for discharge home.  Amount and/or Complexity of Data Reviewed Labs: ordered.  Risk Prescription drug management.    Final Clinical Impression(s) / ED Diagnoses Final diagnoses:  Vaginal pain  Dysuria  Tear of labium, initial encounter  Urinary tract infection without hematuria, site unspecified    Rx / DC Orders ED Discharge Orders          Ordered    Ambulatory referral to Gynecology        09/04/22 2025    cephALEXin (KEFLEX) 500 MG capsule  2 times daily        09/04/22 2025    phenazopyridine (PYRIDIUM) 200 MG tablet  3 times daily        09/04/22 2025              Elgie Congo, MD 09/04/22 2027

## 2022-09-04 NOTE — ED Triage Notes (Signed)
Pain for about a month.    Started with period.  Noticed tear last night.  Been having burning pain for about a week.

## 2022-09-04 NOTE — Discharge Instructions (Addendum)
You have a UTI and a small tear of your vagina as discussed which will heal on its own.  Continue to do sits baths 1-2 times daily.  You can buy this over-the-counter at a pharmacy.  You can also take Tylenol and ibuprofen for pain.  Take the antibiotics for UTI.  Take the Pyridium pills to help with the burning and dysuria.  Make an appointment with the gynecology group set been listed in your discharge paperwork.  Come back if any severe uncontrolled pain, uncontrolled bleeding, fainting, high fevers with new skin changes, or any other symptoms concerning to you.

## 2022-09-06 LAB — URINE CULTURE

## 2022-09-07 LAB — GC/CHLAMYDIA PROBE AMP (~~LOC~~) NOT AT ARMC
Chlamydia: NEGATIVE
Comment: NEGATIVE
Comment: NORMAL
Neisseria Gonorrhea: NEGATIVE

## 2022-09-17 NOTE — Progress Notes (Signed)
Tysabri Discontinuation  form received  filled out and faxed back.

## 2022-09-18 ENCOUNTER — Ambulatory Visit (INDEPENDENT_AMBULATORY_CARE_PROVIDER_SITE_OTHER): Payer: 59

## 2022-09-18 VITALS — BP 139/77 | HR 94 | Temp 98.6°F | Resp 18 | Ht 64.0 in | Wt 351.2 lb

## 2022-09-18 DIAGNOSIS — G35 Multiple sclerosis: Secondary | ICD-10-CM | POA: Diagnosis not present

## 2022-09-18 MED ORDER — METHYLPREDNISOLONE SODIUM SUCC 125 MG IJ SOLR
125.0000 mg | Freq: Once | INTRAMUSCULAR | Status: AC
  Administered 2022-09-18: 125 mg via INTRAVENOUS
  Filled 2022-09-18: qty 2

## 2022-09-18 MED ORDER — ACETAMINOPHEN 325 MG PO TABS
650.0000 mg | ORAL_TABLET | Freq: Once | ORAL | Status: AC
  Administered 2022-09-18: 650 mg via ORAL
  Filled 2022-09-18: qty 2

## 2022-09-18 MED ORDER — SODIUM CHLORIDE 0.9 % IV SOLN
600.0000 mg | Freq: Once | INTRAVENOUS | Status: AC
  Administered 2022-09-18: 600 mg via INTRAVENOUS
  Filled 2022-09-18: qty 20

## 2022-09-18 MED ORDER — DIPHENHYDRAMINE HCL 25 MG PO CAPS
50.0000 mg | ORAL_CAPSULE | Freq: Once | ORAL | Status: AC
  Administered 2022-09-18: 50 mg via ORAL
  Filled 2022-09-18: qty 2

## 2022-09-18 NOTE — Progress Notes (Signed)
Diagnosis: Multiple Sclerosis  Provider:  Chilton Greathouse MD  Procedure: Infusion  IV Type: Peripheral, IV Location: L Antecubital  Ocrevus (Ocrelizumab), Dose: 600 mg  Infusion Start Time: 0945  Infusion Stop Time: 1346  Post Infusion IV Care: Observation period completed and Peripheral IV Discontinued  Discharge: Condition: Good, Destination: Home . AVS provided to patient.   Performed by:  Loney Hering, LPN

## 2022-10-14 ENCOUNTER — Encounter: Payer: Self-pay | Admitting: Neurology

## 2022-10-14 ENCOUNTER — Other Ambulatory Visit: Payer: Self-pay | Admitting: Neurology

## 2022-10-14 ENCOUNTER — Ambulatory Visit
Admission: EM | Admit: 2022-10-14 | Discharge: 2022-10-14 | Disposition: A | Discharge status: Home / Self Care | Payer: 59 | Attending: Internal Medicine | Admitting: Internal Medicine

## 2022-10-14 DIAGNOSIS — J069 Acute upper respiratory infection, unspecified: Secondary | ICD-10-CM | POA: Insufficient documentation

## 2022-10-14 DIAGNOSIS — R6889 Other general symptoms and signs: Secondary | ICD-10-CM | POA: Diagnosis present

## 2022-10-14 DIAGNOSIS — Z1152 Encounter for screening for COVID-19: Secondary | ICD-10-CM | POA: Insufficient documentation

## 2022-10-14 DIAGNOSIS — Z20828 Contact with and (suspected) exposure to other viral communicable diseases: Secondary | ICD-10-CM | POA: Diagnosis not present

## 2022-10-14 MED ORDER — OSELTAMIVIR PHOSPHATE 75 MG PO CAPS: 75.0000 mg | ORAL_CAPSULE | Freq: Two times a day (BID) | ORAL | 0 refills | Status: DC

## 2022-10-14 MED ORDER — ACETAMINOPHEN 325 MG PO TABS
650.0000 mg | ORAL_TABLET | Freq: Once | ORAL | Status: AC
  Administered 2022-10-14: 650 mg via ORAL

## 2022-10-14 NOTE — ED Triage Notes (Signed)
Pt c/o cough, feels like swallowing glass, nausea, vomiting, headache, fatigue, malaise, body chills,   Onset ~ yesterday   Flu exposure

## 2022-10-15 ENCOUNTER — Other Ambulatory Visit: Payer: Self-pay | Admitting: Neurology

## 2022-10-15 MED ORDER — GABAPENTIN 600 MG PO TABS: 600.0000 mg | ORAL_TABLET | Freq: Three times a day (TID) | ORAL | 5 refills | Status: DC

## 2022-10-16 LAB — SARS CORONAVIRUS 2 (TAT 6-24 HRS): SARS Coronavirus 2: NEGATIVE

## 2022-10-19 NOTE — ED Provider Notes (Signed)
[facility]    CSN: [id number] Arrival date & time: 10/14/22  1746      History   Chief Complaint Chief Complaint  Patient presents with   Cough    HPI Michelle Velasquez is a 32 y.o. female.   Patient here today for evaluation of sore throat, fever, chills, cough that started yesterday. She has also had some headache, nausea and vomiting. She has had known flu exposure. She does not report treatment for symptoms.  The history is provided by the patient.  Cough Associated symptoms: chills, fever, headaches and sore throat   Associated symptoms: no ear pain, no eye discharge, no shortness of breath and no wheezing     Past Medical History:  Diagnosis Date   Allergy    Anxiety    Depression    Headache    Neuromuscular disorder (HCC)    PCOS (polycystic ovarian syndrome)     Patient Active Problem List   Diagnosis Date Noted   Severe episode of recurrent major depressive disorder, without psychotic features ([place]) 07/07/2021   Multiple sclerosis ([place]) 12/27/2020   Migraine with aura and without status migrainosus, not intractable 05/16/2020   BMI 60.0-69.9, adult ([place]) 05/16/2020   Dysmenorrhea 05/16/2020   Menorrhagia with irregular cycle 05/16/2020    History reviewed. No pertinent surgical history.  OB History     Gravida  0   Para  0   Term  0   Preterm  0   AB  0   Living  0      SAB  0   IAB  0   Ectopic  0   Multiple  0   Live Births  0            Home Medications    Prior to Admission medications   Medication Sig Start Date End Date Taking? Authorizing Provider  oseltamivir (TAMIFLU) 75 MG capsule Take 1 capsule (75 mg total) by mouth every 12 (twelve) hours. 10/14/22  Yes Francene Finders, PA-C  albuterol (VENTOLIN HFA) 108 (90 Base) MCG/ACT inhaler Inhale 2 puffs into the lungs every 6 (six) hours as needed for wheezing or shortness of breath (Cough). 01/11/22   Lynden Oxford Scales, PA-C  baclofen (LIORESAL)  10 MG tablet Take 1 tablet (10 mg total) by mouth 3 (three) times daily as needed for muscle spasms. 08/20/22   Pieter Partridge, DO  cetirizine (ZYRTEC ALLERGY) 10 MG tablet Take 1 tablet (10 mg total) by mouth at bedtime. 01/11/22 07/10/22  Lynden Oxford Scales, PA-C  Cholecalciferol (VITAMIN D3) 1.25 MG (50000 UT) CAPS TAKE 1 CAPSULE BY MOUTH EVERY 7 DAYS 02/17/22   Tomi Likens, Stephan Minister, DO  Erenumab-aooe (AIMOVIG) 140 MG/ML SOAJ Inject 140 mg into the skin every 30 (thirty) days. 02/17/22   Tomi Likens, Adam R, DO  gabapentin (NEURONTIN) 600 MG tablet Take 1 tablet (600 mg total) by mouth 3 (three) times daily. 10/15/22   Tomi Likens, Adam R, DO  ondansetron (ZOFRAN) 4 MG tablet Take 1-2 tablets (4-8 mg total) by mouth every 8 (eight) hours as needed. 03/18/22   Pieter Partridge, DO  phenazopyridine (PYRIDIUM) 200 MG tablet Take 1 tablet (200 mg total) by mouth 3 (three) times daily. 09/04/22   Elgie Congo, MD  rizatriptan (MAXALT-MLT) 10 MG disintegrating tablet Take 1 tablet (10 mg total) by mouth as needed for migraine. May repeat in 2 hours if needed.  Maximum 2 tablets in 24 hours. 08/20/22   Metta Clines  R, DO  venlafaxine XR (EFFEXOR-XR) 150 MG 24 hr capsule TAKE 1 CAPSULE(150 MG) BY MOUTH DAILY WITH BREAKFAST 04/15/22   Pieter Partridge, DO    Family History Family History  Problem Relation Age of Onset   Diabetes Father    Multiple sclerosis Mother    Fibroids Mother    Fibromyalgia Mother     Social History Social History   Tobacco Use   Smoking status: Never   Smokeless tobacco: Never  Vaping Use   Vaping Use: Never used  Substance Use Topics   Alcohol use: Never   Drug use: Never     Allergies   Sulfa antibiotics, Gadolinium derivatives, and Grass pollen(k-o-r-t-swt vern)   Review of Systems Review of Systems  Constitutional:  Positive for chills and fever.  HENT:  Positive for congestion, sinus pressure and sore throat. Negative for ear pain.   Eyes:  Negative for discharge and redness.   Respiratory:  Positive for cough. Negative for shortness of breath and wheezing.   Gastrointestinal:  Positive for nausea and vomiting. Negative for diarrhea.  Neurological:  Positive for headaches.     Physical Exam Triage Vital Signs ED Triage Vitals [10/14/22 1946]  Enc Vitals Group     BP 123/85     Pulse Rate (!) 120     Resp 20     Temp (!) 102.4 F (39.1 C)     Temp Source Oral     SpO2 96 %     Weight      Height      Head Circumference      Peak Flow      Pain Score 9     Pain Loc      Pain Edu?      Excl. in [place]?    No data found.  Updated Vital Signs BP 123/85 (BP Location: Left Arm)   Pulse (!) 120   Temp (!) 102.4 F (39.1 C) (Oral)   Resp 20   SpO2 96%      Physical Exam Vitals and nursing note reviewed.  Constitutional:      General: She is in acute distress (appears to not feel well).     Appearance: She is not ill-appearing.  HENT:     Head: Normocephalic and atraumatic.     Nose: Congestion present.     Mouth/Throat:     Mouth: Mucous membranes are moist.     Pharynx: No oropharyngeal exudate or posterior oropharyngeal erythema.  Eyes:     Conjunctiva/sclera: Conjunctivae normal.  Cardiovascular:     Rate and Rhythm: Normal rate and regular rhythm.     Heart sounds: Normal heart sounds. No murmur heard. Pulmonary:     Effort: Pulmonary effort is normal. No respiratory distress.     Breath sounds: Normal breath sounds. No wheezing, rhonchi or rales.  Skin:    General: Skin is warm and dry.  Neurological:     Mental Status: She is alert.  Psychiatric:        Mood and Affect: Mood normal.        Thought Content: Thought content normal.      UC Treatments / Results  Labs (all labs ordered are listed, but only abnormal results are displayed) Labs Reviewed  SARS CORONAVIRUS 2 (TAT 6-24 HRS)    EKG   Radiology No results found.  Procedures Procedures (including critical care time)  Medications Ordered in UC Medications   acetaminophen (TYLENOL) tablet 650 mg (650  mg Oral Given 10/14/22 1948)    Initial Impression / Assessment and Plan / UC Course  I have reviewed the triage vital signs and the nursing notes.  Pertinent labs & imaging results that were available during my care of the patient were reviewed by me and considered in my medical decision making (see chart for details).    Suspect influenza given known exposure. Tamiflu  prescribed. Will also order covid screening. Encouraged symptomatic treatment otherwise and follow up if no gradual improvement or with any further concerns.   Final Clinical Impressions(s) / UC Diagnoses   Final diagnoses:  Acute upper respiratory infection  Encounter for screening for COVID-19  Flu-like symptoms  Exposure to the flu   Discharge Instructions   None    ED Prescriptions     Medication Sig Dispense Auth. Provider   oseltamivir (TAMIFLU) 75 MG capsule Take 1 capsule (75 mg total) by mouth every 12 (twelve) hours. 10 capsule Tomi Bamberger, PA-C      PDMP not reviewed this encounter.   Tomi Bamberger, PA-C 10/19/22 [phone]

## 2022-10-29 ENCOUNTER — Other Ambulatory Visit: Payer: Self-pay | Admitting: Neurology

## 2022-11-11 ENCOUNTER — Encounter: Payer: Self-pay | Admitting: Neurology

## 2022-11-12 ENCOUNTER — Other Ambulatory Visit: Payer: Self-pay | Admitting: Neurology

## 2022-11-12 ENCOUNTER — Other Ambulatory Visit: Payer: Self-pay

## 2022-11-12 MED ORDER — VENLAFAXINE HCL ER 150 MG PO CP24: ORAL_CAPSULE | ORAL | 5 refills | Status: DC

## 2022-11-12 NOTE — Telephone Encounter (Signed)
Per Patient mychart,  I have reached out to [name]  multiple times to have my venlafaxine refilled and they keep telling me that I do not have any refills remaining. My chart says I should still have refills available until June of 2024. If I recall you sent refills in for me during kg last appointment, however, for some reason [facility]  seems incapable of finding it. Is there any way you could please reach out to them and provide them with a new RX for the venlafaxine?

## 2022-11-18 ENCOUNTER — Encounter: Payer: Self-pay | Admitting: Neurology

## 2022-11-18 ENCOUNTER — Other Ambulatory Visit: Payer: Self-pay | Admitting: Neurology

## 2022-11-18 ENCOUNTER — Telehealth: Payer: Self-pay

## 2022-11-18 MED ORDER — AIMOVIG 140 MG/ML ~~LOC~~ SOAJ
140.0000 mg | SUBCUTANEOUS | 3 refills | Status: DC
  Filled 2023-02-11: qty 1, 30d supply, fill #0
  Filled 2023-03-21: qty 1, 30d supply, fill #1
  Filled 2023-04-19: qty 1, 30d supply, fill #2
  Filled 2023-05-26: qty 1, 30d supply, fill #3
  Filled 2023-06-24: qty 1, 30d supply, fill #4
  Filled 2023-07-22: qty 1, 30d supply, fill #5
  Filled 2023-09-01 – 2023-09-09 (×2): qty 1, 30d supply, fill #6
  Filled 2023-10-11: qty 3, 90d supply, fill #7

## 2022-11-18 MED ORDER — BACLOFEN 10 MG PO TABS
10.0000 mg | ORAL_TABLET | Freq: Three times a day (TID) | ORAL | 4 refills | Status: DC | PRN
  Filled 2023-02-11 – 2023-02-28 (×2): qty 90, 30d supply, fill #0
  Filled 2023-03-27: qty 90, 30d supply, fill #1
  Filled 2023-04-27: qty 90, 30d supply, fill #2
  Filled 2023-05-26: qty 90, 30d supply, fill #3
  Filled 2023-06-24: qty 90, 30d supply, fill #4
  Filled 2023-07-22: qty 90, 30d supply, fill #5
  Filled 2023-09-01: qty 90, 30d supply, fill #6

## 2022-11-18 MED ORDER — GABAPENTIN 600 MG PO TABS
600.0000 mg | ORAL_TABLET | Freq: Three times a day (TID) | ORAL | 1 refills | Status: DC
  Filled 2023-02-11: qty 90, 30d supply, fill #0

## 2022-11-18 MED ORDER — VITAMIN D3 1.25 MG (50000 UT) PO CAPS: 1.0000 | ORAL_CAPSULE | ORAL | 0 refills | Status: DC

## 2022-11-18 NOTE — Telephone Encounter (Signed)
Can get a 90day supply of all my medications or all that qualify please.  Per Dr.Jaffe, Please send 90 day refills:  Aimovig, rizatriptan, baclofen,  vit D.  I'm not sure if rizatriptan can be 90 days

## 2022-11-19 ENCOUNTER — Encounter: Payer: Self-pay | Admitting: Neurology

## 2022-11-23 ENCOUNTER — Encounter: Payer: Self-pay | Admitting: Nurse Practitioner

## 2022-12-17 ENCOUNTER — Ambulatory Visit (INDEPENDENT_AMBULATORY_CARE_PROVIDER_SITE_OTHER): Payer: 59 | Admitting: Nurse Practitioner

## 2022-12-17 ENCOUNTER — Encounter: Payer: Self-pay | Admitting: Nurse Practitioner

## 2022-12-17 VITALS — BP 120/80 | HR 107 | Temp 97.4°F | Resp 16 | Ht 62.0 in | Wt 353.0 lb

## 2022-12-17 DIAGNOSIS — G35 Multiple sclerosis: Secondary | ICD-10-CM

## 2022-12-17 DIAGNOSIS — R739 Hyperglycemia, unspecified: Secondary | ICD-10-CM | POA: Insufficient documentation

## 2022-12-17 DIAGNOSIS — F411 Generalized anxiety disorder: Secondary | ICD-10-CM

## 2022-12-17 DIAGNOSIS — F332 Major depressive disorder, recurrent severe without psychotic features: Secondary | ICD-10-CM | POA: Diagnosis not present

## 2022-12-17 DIAGNOSIS — D500 Iron deficiency anemia secondary to blood loss (chronic): Secondary | ICD-10-CM | POA: Diagnosis not present

## 2022-12-17 MED ORDER — BUSPIRONE HCL 7.5 MG PO TABS
7.5000 mg | ORAL_TABLET | Freq: Two times a day (BID) | ORAL | 5 refills | Status: DC
  Filled 2023-02-11: qty 60, 30d supply, fill #0
  Filled 2023-03-21: qty 60, 30d supply, fill #1
  Filled 2023-04-19: qty 60, 30d supply, fill #2
  Filled 2023-05-26: qty 60, 30d supply, fill #3
  Filled 2023-06-24: qty 60, 30d supply, fill #4

## 2022-12-17 NOTE — Assessment & Plan Note (Addendum)
Under the care of neurology-Dr. Tomi Likens, appt every 55month Current use of Ocrevus IV and vit. D

## 2022-12-17 NOTE — Assessment & Plan Note (Signed)
Current use of effexor '150mg'$ . No improvement in mood, worsening mood with uncontrolled pain and work stress. Denies any SI/HI, but she thinks about death and self worth.  Agreed to psychology referral Add buspar 7.'5mg'$  BID F/up in 69month

## 2022-12-17 NOTE — Assessment & Plan Note (Signed)
Repeat hgbA1c Advised about importance of lifestyle modification

## 2022-12-17 NOTE — Patient Instructions (Signed)
Go to lab Start buspar 7.'5mg'$  BID Maintain effexor dose Schedule appt with therapist.  Managing Anxiety, Adult After being diagnosed with anxiety, you may be relieved to know why you have felt or behaved a certain way. You may also feel overwhelmed about the treatment ahead and what it will mean for your life. With care and support, you can manage your anxiety. How to manage lifestyle changes Understanding the difference between stress and anxiety Although stress can play a role in anxiety, it is not the same as anxiety. Stress is your body's reaction to life changes and events, both good and bad. Stress is often caused by something external, such as a deadline, test, or competition. It normally goes away after the event has ended and will last just a few hours. But, stress can be ongoing and can lead to more than just stress. Anxiety is caused by something internal, such as imagining a terrible outcome or worrying that something will go wrong that will greatly upset you. Anxiety often does not go away even after the event is over, and it can become a long-term (chronic) worry. Lowering stress and anxiety Talk with your health care provider or a counselor to learn more about lowering anxiety and stress. They may suggest tension-reduction techniques, such as: Music. Spend time creating or listening to music that you enjoy and that inspires you. Mindfulness-based meditation. Practice being aware of your normal breaths while not trying to control your breathing. It can be done while sitting or walking. Centering prayer. Focus on a word, phrase, or sacred image that means something to you and brings you peace. Deep breathing. Expand your stomach and inhale slowly through your nose. Hold your breath for 3-5 seconds. Then breathe out slowly, letting your stomach muscles relax. Self-talk. Learn to notice and spot thought patterns that lead to anxiety reactions. Change those patterns to thoughts that feel  peaceful. Muscle relaxation. Take time to tense muscles and then relax them. Choose a tension-reduction technique that fits your lifestyle and personality. These techniques take time and practice. Set aside 5-15 minutes a day to do them. Specialized therapists can offer counseling and training in these techniques. The training to help with anxiety may be covered by some insurance plans. Other things you can do to manage stress and anxiety include: Keeping a stress diary. This can help you learn what triggers your reaction and then learn ways to manage your response. Thinking about how you react to certain situations. You may not be able to control everything, but you can control your response. Making time for activities that help you relax and not feeling guilty about spending your time in this way. Doing visual imagery. This involves imagining or creating mental pictures to help you relax. Practicing yoga. Through yoga poses, you can lower tension and relax.  Medicines Medicines for anxiety include: Antidepressant medicines. These are usually prescribed for long-term daily control. Anti-anxiety medicines. These may be added in severe cases, especially when panic attacks occur. When used together, medicines, psychotherapy, and tension-reduction techniques may be the most effective treatment. Relationships Relationships can play a big part in helping you recover. Spend more time connecting with trusted friends and family members. Think about going to couples counseling if you have a partner, taking family education classes, or going to family therapy. Therapy can help you and others better understand your anxiety. How to recognize changes in your anxiety Everyone responds differently to treatment for anxiety. Recovery from anxiety happens when symptoms lessen and  stop interfering with your daily life at home or work. This may mean that you will start to: Have better concentration and focus. Worry  will interfere less in your daily thinking. Sleep better. Be less irritable. Have more energy. Have improved memory. Try to recognize when your condition is getting worse. Contact your provider if your symptoms interfere with home or work and you feel like your condition is not improving. Follow these instructions at home: Activity Exercise. Adults should: Exercise for at least 150 minutes each week. The exercise should increase your heart rate and make you sweat (moderate-intensity exercise). Do strengthening exercises at least twice a week. Get the right amount and quality of sleep. Most adults need 7-9 hours of sleep each night. Lifestyle  Eat a healthy diet that includes plenty of vegetables, fruits, whole grains, low-fat dairy products, and lean protein. Do not eat a lot of foods that are high in fats, added sugars, or salt (sodium). Make choices that simplify your life. Do not use any products that contain nicotine or tobacco. These products include cigarettes, chewing tobacco, and vaping devices, such as e-cigarettes. If you need help quitting, ask your provider. Avoid caffeine, alcohol, and certain over-the-counter cold medicines. These may make you feel worse. Ask your pharmacist which medicines to avoid. General instructions Take over-the-counter and prescription medicines only as told by your provider. Keep all follow-up visits. This is to make sure you are managing your anxiety well or if you need more support. Where to find support You can get help and support from: Self-help groups. Online and [employer]. A trusted spiritual leader. Couples counseling. Family education classes. Family therapy. Where to find more information You may find that joining a support group helps you deal with your anxiety. The following sources can help you find counselors or support groups near you: [place]: mentalhealthamerica.net Anxiety and Depression Association  of [place] (ADAA): adaa.[place] on Mental Illness (NAMI): nami.org Contact a health care provider if: You have a hard time staying focused or finishing tasks. You spend many hours a day feeling worried about everyday life. You are very tired because you cannot stop worrying. You start to have headaches or often feel tense. You have chronic nausea or diarrhea. Get help right away if: Your heart feels like it is racing. You have shortness of breath. You have thoughts of hurting yourself or others. Get help right away if you feel like you may hurt yourself or others, or have thoughts about taking your own life. Go to your nearest emergency room or: Call 911. Call the [place] at [phone] or 988. This is open 24 hours a day. Text the Crisis Text Line at 4430406172. This information is not intended to replace advice given to you by your health care provider. Make sure you discuss any questions you have with your health care provider. Document Revised: 07/14/2022 Document Reviewed: 01/26/2021 Elsevier Patient Education  [place].

## 2022-12-17 NOTE — Assessment & Plan Note (Signed)
Repeat cbc and iron panel

## 2022-12-17 NOTE — Progress Notes (Signed)
Established Patient Visit  Patient: Michelle Velasquez   DOB: 01-Nov-1990   32 y.o. Female  MRN: [id number] Visit Date: 12/17/2022  Subjective:    Chief Complaint  Patient presents with   Referral     Therapy Referral    HPI Severe episode of recurrent major depressive disorder, without psychotic features ([place]) Current use of effexor '150mg'$ . No improvement in mood, worsening mood with uncontrolled pain and work stress. Denies any SI/HI, but she thinks about death and self worth.  Agreed to psychology referral Add buspar 7.'5mg'$  BID F/up in 18month Iron deficiency anemia due to chronic blood loss Repeat cbc and iron panel  Hyperglycemia Repeat hgbA1c Advised about importance of lifestyle modification  Multiple sclerosis ([place] Under the care of neurology-Dr. JTomi Likens appt every 661monthCurrent use of Ocrevus IV and vit. D     12/17/2022    2:42 PM 07/07/2021    1:51 PM  Depression screen PHQ 2/9  Decreased Interest 3 3  Down, Depressed, Hopeless 2 3  PHQ - 2 Score 5 6  Altered sleeping 3 3  Tired, decreased energy 3 3  Change in appetite 3 3  Feeling bad or failure about yourself  1 3  Trouble concentrating 3 3  Moving slowly or fidgety/restless 3 3  Suicidal thoughts 1 3  PHQ-9 Score 22 27  Difficult doing work/chores Very difficult Extremely dIfficult       12/17/2022    2:43 PM 07/07/2021    1:51 PM  GAD 7 : Generalized Anxiety Score  Nervous, Anxious, on Edge 1 3  Control/stop worrying 2 2  Worry too much - different things 2 2  Trouble relaxing 3 3  Restless 0 1  Easily annoyed or irritable 1 3  Afraid - awful might happen 3 3  Total GAD 7 Score 12 17  Anxiety Difficulty Very difficult Very difficult    Reviewed medical, surgical, and social history today  Medications: Outpatient Medications Prior to Visit  Medication Sig   ocrelizumab (OCREVUS) 300 MG/10ML injection Inject 300 mg into the vein once.   albuterol (VENTOLIN HFA) 108 (90 Base)  MCG/ACT inhaler Inhale 2 puffs into the lungs every 6 (six) hours as needed for wheezing or shortness of breath (Cough).   baclofen (LIORESAL) 10 MG tablet Take 1 tablet (10 mg total) by mouth 3 (three) times daily as needed for muscle spasms.   cetirizine (ZYRTEC ALLERGY) 10 MG tablet Take 1 tablet (10 mg total) by mouth at bedtime.   Cholecalciferol (VITAMIN D3) 1.25 MG (50000 UT) CAPS Take 1 capsule (1.25 mg total) by mouth every 7 (seven) days.   Erenumab-aooe (AIMOVIG) 140 MG/ML SOAJ Inject 140 mg into the skin every 30 (thirty) days.   gabapentin (NEURONTIN) 600 MG tablet Take 1 tablet (600 mg total) by mouth 3 (three) times daily.   ondansetron (ZOFRAN) 4 MG tablet Take 1-2 tablets (4-8 mg total) by mouth every 8 (eight) hours as needed.   rizatriptan (MAXALT-MLT) 10 MG disintegrating tablet Take 1 tablet (10 mg total) by mouth as needed for migraine. May repeat in 2 hours if needed.  Maximum 2 tablets in 24 hours.   venlafaxine XR (EFFEXOR-XR) 150 MG 24 hr capsule TAKE 1 CAPSULE(150 MG) BY MOUTH DAILY WITH BREAKFAST   [DISCONTINUED] oseltamivir (TAMIFLU) 75 MG capsule Take 1 capsule (75 mg total) by mouth every 12 (twelve) hours.   [DISCONTINUED] phenazopyridine (PYRIDIUM) 200  MG tablet Take 1 tablet (200 mg total) by mouth 3 (three) times daily.   No facility-administered medications prior to visit.   Reviewed past medical and social history.   ROS per HPI above      Objective:  BP 120/80 (BP Location: Left Arm, Patient Position: Sitting, Cuff Size: Large)   Pulse (!) 107   Temp (!) 97.4 F (36.3 C) (Temporal)   Resp 16   Ht '5\' 2"'$  (1.575 m)   Wt (!) 353 lb (160.1 kg)   SpO2 98%   BMI 64.56 kg/m      Physical Exam Cardiovascular:     Rate and Rhythm: Normal rate and regular rhythm.     Pulses: Normal pulses.     Heart sounds: Normal heart sounds.  Pulmonary:     Effort: Pulmonary effort is normal.     Breath sounds: Normal breath sounds.  Neurological:     Mental  Status: She is alert and oriented to person, place, and time.  Psychiatric:        Attention and Perception: Attention normal.        Mood and Affect: Mood is anxious.        Speech: Speech normal.        Behavior: Behavior is cooperative.     No results found for any visits on 12/17/22.    Assessment & Plan:    Problem List Items Addressed This Visit       Nervous and Auditory   Multiple sclerosis ([place])    Under the care of neurology-Dr. Tomi Likens, appt every 77month Current use of Ocrevus IV and vit. D      Relevant Medications   ocrelizumab (OCREVUS) 300 MG/10ML injection     Other   Hyperglycemia    Repeat hgbA1c Advised about importance of lifestyle modification      Relevant Orders   Hemoglobin A1c   Iron deficiency anemia due to chronic blood loss    Repeat cbc and iron panel      Relevant Orders   Iron, TIBC and Ferritin Panel   Severe episode of recurrent major depressive disorder, without psychotic features ([name]    Current use of effexor '150mg'$ . No improvement in mood, worsening mood with uncontrolled pain and work stress. Denies any SI/HI, but she thinks about death and self worth.  Agreed to psychology referral Add buspar 7.'5mg'$  BID F/up in 153month    Relevant Medications   busPIRone (BUSPAR) 7.5 MG tablet   Other Relevant Orders   Ambulatory referral to Psychology   TSH   Other Visit Diagnoses     GAD (generalized anxiety disorder)       Relevant Medications   busPIRone (BUSPAR) 7.5 MG tablet      Return in about 4 weeks (around 01/14/2023) for depression and anxiety.     [name]

## 2022-12-18 LAB — TSH: TSH: 4.68 u[IU]/mL (ref 0.35–5.50)

## 2022-12-18 LAB — IRON,TIBC AND FERRITIN PANEL
%SAT: 8 % (calc) — ABNORMAL LOW (ref 16–45)
Ferritin: 11 ng/mL — ABNORMAL LOW (ref 16–154)
Iron: 39 ug/dL — ABNORMAL LOW (ref 40–190)
TIBC: 481 mcg/dL (calc) — ABNORMAL HIGH (ref 250–450)

## 2022-12-18 LAB — HEMOGLOBIN A1C: Hgb A1c MFr Bld: 5.7 % (ref 4.6–6.5)

## 2022-12-24 ENCOUNTER — Other Ambulatory Visit (HOSPITAL_COMMUNITY)
Admission: RE | Admit: 2022-12-24 | Discharge: 2022-12-24 | Disposition: A | Discharge status: Home / Self Care | Payer: 59 | Source: Ambulatory Visit | Attending: Obstetrics and Gynecology | Admitting: Obstetrics and Gynecology

## 2022-12-24 ENCOUNTER — Ambulatory Visit (INDEPENDENT_AMBULATORY_CARE_PROVIDER_SITE_OTHER): Payer: 59 | Admitting: Obstetrics and Gynecology

## 2022-12-24 ENCOUNTER — Encounter: Payer: Self-pay | Admitting: Obstetrics and Gynecology

## 2022-12-24 VITALS — BP 158/111 | HR 92 | Ht 63.0 in | Wt 350.8 lb

## 2022-12-24 DIAGNOSIS — M791 Myalgia, unspecified site: Secondary | ICD-10-CM

## 2022-12-24 DIAGNOSIS — N939 Abnormal uterine and vaginal bleeding, unspecified: Secondary | ICD-10-CM | POA: Insufficient documentation

## 2022-12-24 MED ORDER — IRON (FERROUS SULFATE) 325 (65 FE) MG PO TABS: 325.0000 mg | ORAL_TABLET | Freq: Every day | ORAL | 0 refills | Status: AC

## 2022-12-24 NOTE — Progress Notes (Signed)
Iron deficient anemia: start oral ferrous sulfate 1tab daily with food. Take with glass of orange juice to facilitate assumption. Return to lab in 68monthto repeat iron panel Stable hgbA1c at 5.7%: prediabetes Normal TSH Call office to schedule lab appt

## 2022-12-24 NOTE — Addendum Note (Signed)
Addended by: Wilfred Lacy L on: 12/24/2022 01:06 PM   Modules accepted: Orders
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
## 2022-12-24 NOTE — Progress Notes (Signed)
Pt presents with elevated depression screen.  Pt is being managed by psychiatrist.  Schedule U/S for 12/31/22 @ 1100.  Pt notified.   Mel Almond, RN  12/24/22

## 2022-12-25 ENCOUNTER — Encounter: Payer: Self-pay | Admitting: Obstetrics and Gynecology

## 2022-12-25 LAB — CERVICOVAGINAL ANCILLARY ONLY
Bacterial Vaginitis (gardnerella): POSITIVE — AB
Candida Glabrata: NEGATIVE
Candida Vaginitis: NEGATIVE
Chlamydia: NEGATIVE
Comment: NEGATIVE
Comment: NEGATIVE
Comment: NEGATIVE
Comment: NEGATIVE
Comment: NEGATIVE
Comment: NORMAL
Neisseria Gonorrhea: NEGATIVE
Trichomonas: NEGATIVE

## 2022-12-25 MED ORDER — MEGESTROL ACETATE 40 MG PO TABS
40.0000 mg | ORAL_TABLET | Freq: Two times a day (BID) | ORAL | 5 refills | Status: DC
  Filled 2023-02-11: qty 60, 30d supply, fill #0

## 2022-12-28 ENCOUNTER — Other Ambulatory Visit: Payer: Self-pay | Admitting: Obstetrics and Gynecology

## 2022-12-28 DIAGNOSIS — N760 Acute vaginitis: Secondary | ICD-10-CM

## 2022-12-28 LAB — PROLACTIN: Prolactin: 7.8 ng/mL (ref 4.8–33.4)

## 2022-12-28 LAB — ESTRADIOL: Estradiol: 62.6 pg/mL

## 2022-12-28 LAB — TESTOSTERONE,FREE AND TOTAL
Testosterone, Free: 2.8 pg/mL (ref 0.0–4.2)
Testosterone: 35 ng/dL (ref 8–60)

## 2022-12-28 LAB — CBC
Hematocrit: 31.3 % — ABNORMAL LOW (ref 34.0–46.6)
Hemoglobin: 9.3 g/dL — ABNORMAL LOW (ref 11.1–15.9)
MCH: 19.9 pg — ABNORMAL LOW (ref 26.6–33.0)
MCHC: 29.7 g/dL — ABNORMAL LOW (ref 31.5–35.7)
MCV: 67 fL — ABNORMAL LOW (ref 79–97)
Platelets: 335 10*3/uL (ref 150–450)
RBC: 4.67 x10E6/uL (ref 3.77–5.28)
RDW: 21.4 % — ABNORMAL HIGH (ref 11.7–15.4)
WBC: 7.3 10*3/uL (ref 3.4–10.8)

## 2022-12-28 LAB — FOLLICLE STIMULATING HORMONE: FSH: 4.1 m[IU]/mL

## 2022-12-28 MED ORDER — METRONIDAZOLE 500 MG PO TABS: 500.0000 mg | ORAL_TABLET | Freq: Two times a day (BID) | ORAL | 0 refills | Status: AC

## 2022-12-31 ENCOUNTER — Other Ambulatory Visit: Payer: 59

## 2023-01-01 ENCOUNTER — Other Ambulatory Visit: Payer: Self-pay | Admitting: Neurology

## 2023-01-01 ENCOUNTER — Encounter: Payer: Self-pay | Admitting: Neurology

## 2023-01-01 MED ORDER — VENLAFAXINE HCL ER 150 MG PO CP24
150.0000 mg | ORAL_CAPSULE | Freq: Every day | ORAL | 3 refills | Status: DC
  Filled 2023-02-11 – 2023-03-27 (×2): qty 30, 30d supply, fill #0
  Filled 2023-04-28: qty 30, 30d supply, fill #1
  Filled 2023-05-26: qty 30, 30d supply, fill #2
  Filled 2023-09-01: qty 30, 30d supply, fill #3

## 2023-01-01 MED ORDER — VITAMIN D3 1.25 MG (50000 UT) PO CAPS
50000.0000 [IU] | ORAL_CAPSULE | ORAL | 3 refills | Status: DC
  Filled 2023-02-11: qty 4, 28d supply, fill #0
  Filled 2023-03-21: qty 12, 84d supply, fill #0
  Filled 2023-09-09: qty 12, 84d supply, fill #1
  Filled 2023-11-23: qty 12, 84d supply, fill #2

## 2023-01-01 MED ORDER — ONDANSETRON HCL 4 MG PO TABS
4.0000 mg | ORAL_TABLET | Freq: Three times a day (TID) | ORAL | 3 refills | Status: DC | PRN
  Filled 2023-02-11: qty 60, 10d supply, fill #0
  Filled 2023-11-23: qty 60, 10d supply, fill #1

## 2023-01-08 ENCOUNTER — Encounter: Payer: Self-pay | Admitting: Nurse Practitioner

## 2023-01-14 ENCOUNTER — Ambulatory Visit: Payer: 59 | Admitting: Nurse Practitioner

## 2023-01-21 ENCOUNTER — Ambulatory Visit
Admission: RE | Admit: 2023-01-21 | Discharge: 2023-01-21 | Disposition: A | Discharge status: Home / Self Care | Payer: 59 | Source: Ambulatory Visit | Attending: Obstetrics and Gynecology | Admitting: Obstetrics and Gynecology

## 2023-01-21 DIAGNOSIS — N939 Abnormal uterine and vaginal bleeding, unspecified: Secondary | ICD-10-CM | POA: Diagnosis present

## 2023-01-25 ENCOUNTER — Telehealth: Payer: Self-pay | Admitting: Obstetrics and Gynecology

## 2023-01-25 NOTE — Telephone Encounter (Signed)
-----   Message from Lorriane Shire, MD sent at 01/22/2023  2:25 PM EDT ----- Scheduled for in office EMB

## 2023-01-25 NOTE — Telephone Encounter (Signed)
Attempted to reach patient to inform her of an appointment for an endometrial biopsy. Left patient a voicemail message to call the office for the information.

## 2023-01-28 ENCOUNTER — Encounter: Payer: Self-pay | Admitting: Neurology

## 2023-01-29 ENCOUNTER — Encounter: Payer: Self-pay | Admitting: Neurology

## 2023-02-03 ENCOUNTER — Encounter: Payer: Self-pay | Admitting: Neurology

## 2023-02-10 ENCOUNTER — Encounter: Payer: Self-pay | Admitting: Neurology

## 2023-02-10 ENCOUNTER — Other Ambulatory Visit (HOSPITAL_COMMUNITY): Payer: Self-pay

## 2023-02-11 ENCOUNTER — Encounter: Payer: Self-pay | Admitting: Neurology

## 2023-02-11 ENCOUNTER — Other Ambulatory Visit (HOSPITAL_COMMUNITY): Payer: Self-pay

## 2023-02-11 MED ORDER — CHOLECALCIFEROL 1.25 MG (50000 UT) PO CAPS
50000.0000 [IU] | ORAL_CAPSULE | ORAL | 0 refills | Status: DC
  Filled 2023-02-11: qty 36, 252d supply, fill #0

## 2023-02-12 ENCOUNTER — Other Ambulatory Visit (HOSPITAL_COMMUNITY): Payer: Self-pay

## 2023-02-18 ENCOUNTER — Ambulatory Visit
Admission: RE | Admit: 2023-02-18 | Discharge: 2023-02-18 | Disposition: A | Discharge status: Home / Self Care | Payer: 59 | Source: Ambulatory Visit | Attending: Neurology | Admitting: Neurology

## 2023-02-18 ENCOUNTER — Other Ambulatory Visit (INDEPENDENT_AMBULATORY_CARE_PROVIDER_SITE_OTHER): Payer: 59

## 2023-02-18 DIAGNOSIS — G35 Multiple sclerosis: Secondary | ICD-10-CM

## 2023-02-18 LAB — VITAMIN D 25 HYDROXY (VIT D DEFICIENCY, FRACTURES): VITD: 39.65 ng/mL (ref 30.00–100.00)

## 2023-02-18 MED ORDER — GADOPICLENOL 0.5 MMOL/ML IV SOLN
10.0000 mL | Freq: Once | INTRAVENOUS | Status: AC | PRN
  Administered 2023-02-18: 10 mL via INTRAVENOUS

## 2023-02-19 LAB — IGG, IGA, IGM
IgG (Immunoglobin G), Serum: 835 mg/dL (ref 600–1640)
IgM, Serum: 69 mg/dL (ref 50–300)
Immunoglobulin A: 534 mg/dL — ABNORMAL HIGH (ref 47–310)

## 2023-02-22 ENCOUNTER — Encounter: Payer: Self-pay | Admitting: Neurology

## 2023-02-23 ENCOUNTER — Telehealth: Payer: Self-pay

## 2023-02-23 NOTE — Telephone Encounter (Signed)
Had to call patient mychart message not readable.    Per patient the pain has gotten worse today.  What should she do?

## 2023-02-25 ENCOUNTER — Ambulatory Visit: Payer: 59 | Admitting: Physical Therapy

## 2023-03-01 ENCOUNTER — Other Ambulatory Visit: Payer: Self-pay

## 2023-03-01 ENCOUNTER — Other Ambulatory Visit (HOSPITAL_COMMUNITY): Payer: Self-pay
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
## 2023-03-04 ENCOUNTER — Other Ambulatory Visit (HOSPITAL_COMMUNITY): Payer: Self-pay

## 2023-03-04 ENCOUNTER — Encounter: Payer: Self-pay | Admitting: Neurology

## 2023-03-04 ENCOUNTER — Telehealth (INDEPENDENT_AMBULATORY_CARE_PROVIDER_SITE_OTHER): Payer: 59 | Admitting: Neurology

## 2023-03-04 DIAGNOSIS — R5383 Other fatigue: Secondary | ICD-10-CM | POA: Diagnosis not present

## 2023-03-04 DIAGNOSIS — G43109 Migraine with aura, not intractable, without status migrainosus: Secondary | ICD-10-CM | POA: Diagnosis not present

## 2023-03-04 DIAGNOSIS — G8929 Other chronic pain: Secondary | ICD-10-CM | POA: Diagnosis not present

## 2023-03-04 DIAGNOSIS — G35 Multiple sclerosis: Secondary | ICD-10-CM

## 2023-03-04 MED ORDER — GABAPENTIN 300 MG PO CAPS
900.0000 mg | ORAL_CAPSULE | Freq: Three times a day (TID) | ORAL | 3 refills | Status: DC
  Filled 2023-03-04: qty 270, 30d supply, fill #0
  Filled 2023-03-27: qty 270, 30d supply, fill #1
  Filled 2023-04-28: qty 270, 30d supply, fill #2
  Filled 2023-06-01: qty 270, 30d supply, fill #3
  Filled 2023-06-24: qty 270, 30d supply, fill #4
  Filled 2023-09-01: qty 270, 30d supply, fill #5
  Filled 2023-10-04: qty 810, 90d supply, fill #6
  Filled 2024-01-11: qty 810, 90d supply, fill #7

## 2023-03-04 MED ORDER — MODAFINIL 200 MG PO TABS
200.0000 mg | ORAL_TABLET | Freq: Every morning | ORAL | 5 refills | Status: DC
  Filled 2023-03-04: qty 30, 30d supply, fill #0
  Filled 2023-03-21 – 2023-03-31 (×3): qty 30, 30d supply, fill #1
  Filled 2023-04-27: qty 30, 30d supply, fill #2

## 2023-03-04 NOTE — Patient Instructions (Addendum)
Continue Ocrevus and vitamin D For migraines:  Aimovig, venlafaxine and rizatriptan and Zofran For pain: increase gabapentin to 900mg  three times daily.  Contiue baclofen 10mg  three times daily For chronic fatigue, will start modafinil 200mg  every morning Check labs in 6 months Follow up in 6 months (after labs)

## 2023-03-09 ENCOUNTER — Ambulatory Visit: Payer: 59 | Admitting: Obstetrics and Gynecology

## 2023-03-19 ENCOUNTER — Ambulatory Visit: Payer: 59

## 2023-03-22 ENCOUNTER — Other Ambulatory Visit: Payer: Self-pay

## 2023-03-22 ENCOUNTER — Other Ambulatory Visit (HOSPITAL_COMMUNITY): Payer: Self-pay

## 2023-03-22 ENCOUNTER — Encounter: Payer: Self-pay | Admitting: Neurology

## 2023-03-29 ENCOUNTER — Other Ambulatory Visit: Payer: Self-pay

## 2023-04-01 ENCOUNTER — Ambulatory Visit (INDEPENDENT_AMBULATORY_CARE_PROVIDER_SITE_OTHER): Payer: 59

## 2023-04-01 ENCOUNTER — Other Ambulatory Visit: Payer: Self-pay

## 2023-04-01 ENCOUNTER — Encounter: Payer: Self-pay | Admitting: Neurology

## 2023-04-01 VITALS — BP 123/81 | HR 111 | Temp 98.9°F | Resp 18 | Ht 64.0 in | Wt 348.4 lb

## 2023-04-01 DIAGNOSIS — G35 Multiple sclerosis: Secondary | ICD-10-CM | POA: Diagnosis not present

## 2023-04-01 MED ORDER — ACETAMINOPHEN 325 MG PO TABS
650.0000 mg | ORAL_TABLET | Freq: Once | ORAL | Status: AC
  Administered 2023-04-01: 650 mg via ORAL
  Filled 2023-04-01: qty 2

## 2023-04-01 MED ORDER — DIPHENHYDRAMINE HCL 25 MG PO CAPS
50.0000 mg | ORAL_CAPSULE | Freq: Once | ORAL | Status: AC
  Administered 2023-04-01: 50 mg via ORAL
  Filled 2023-04-01: qty 2

## 2023-04-01 MED ORDER — SODIUM CHLORIDE 0.9 % IV SOLN
600.0000 mg | Freq: Once | INTRAVENOUS | Status: AC
  Administered 2023-04-01: 600 mg via INTRAVENOUS
  Filled 2023-04-01: qty 20

## 2023-04-01 MED ORDER — METHYLPREDNISOLONE SODIUM SUCC 125 MG IJ SOLR
125.0000 mg | Freq: Once | INTRAMUSCULAR | Status: AC
  Administered 2023-04-01: 125 mg via INTRAVENOUS
  Filled 2023-04-01: qty 2

## 2023-04-01 NOTE — Progress Notes (Signed)
Diagnosis: Multiple Sclerosis  Provider:  Chilton Greathouse MD  Procedure: IV Infusion  IV Type: Peripheral, IV Location: L Antecubital  Ocrevus (Ocrelizumab), Dose: 600 mg  Infusion Start Time: 0919  Infusion Stop Time: 1322  Post Infusion IV Care: Observation period completed and Peripheral IV Discontinued  Discharge: Condition: Good, Destination: Home . AVS Declined  Performed by:  Nat Math, RN

## 2023-04-02 ENCOUNTER — Telehealth: Payer: Self-pay

## 2023-04-02 NOTE — Telephone Encounter (Signed)
Per patient mychart message:  Hey I'm getting my treatment today.   I'm not feel too good and I'm about get the iv taken out. If I wake up and I dont feel well  could I  get a note for work or do I need to  do a virtual visit?    Per Dr.jaffe: Yes, we can provide a note     Work note sent.

## 2023-04-21 ENCOUNTER — Other Ambulatory Visit (HOSPITAL_COMMUNITY): Payer: Self-pay

## 2023-04-28 ENCOUNTER — Other Ambulatory Visit: Payer: Self-pay

## 2023-07-29 ENCOUNTER — Encounter: Payer: Self-pay | Admitting: Neurology

## 2023-07-30 ENCOUNTER — Other Ambulatory Visit: Payer: Self-pay

## 2023-07-30 DIAGNOSIS — G35 Multiple sclerosis: Secondary | ICD-10-CM

## 2023-07-30 NOTE — Progress Notes (Signed)
Per Last ov notes 03/04/23, Check quantitative immunoglobulin, vit D n 6 months

## 2023-08-20 ENCOUNTER — Encounter: Payer: Self-pay | Admitting: Neurology

## 2023-08-25 ENCOUNTER — Encounter: Payer: Self-pay | Admitting: Neurology

## 2023-08-25 ENCOUNTER — Other Ambulatory Visit (INDEPENDENT_AMBULATORY_CARE_PROVIDER_SITE_OTHER): Payer: Managed Care, Other (non HMO)

## 2023-08-25 DIAGNOSIS — G35 Multiple sclerosis: Secondary | ICD-10-CM

## 2023-08-25 LAB — VITAMIN D 25 HYDROXY (VIT D DEFICIENCY, FRACTURES): VITD: 31.08 ng/mL (ref 30.00–100.00)

## 2023-08-26 LAB — IGG, IGA, IGM
IgG (Immunoglobin G), Serum: 849 mg/dL (ref 600–1640)
IgM, Serum: 71 mg/dL (ref 50–300)
Immunoglobulin A: 569 mg/dL — ABNORMAL HIGH (ref 47–310)

## 2023-09-01 ENCOUNTER — Other Ambulatory Visit: Payer: Self-pay

## 2023-09-01 ENCOUNTER — Ambulatory Visit (HOSPITAL_COMMUNITY)
Admission: EM | Admit: 2023-09-01 | Discharge: 2023-09-01 | Disposition: A | Discharge status: Home / Self Care | Payer: 59

## 2023-09-01 ENCOUNTER — Encounter: Payer: Self-pay | Admitting: Neurology

## 2023-09-01 ENCOUNTER — Encounter (HOSPITAL_COMMUNITY): Payer: Self-pay

## 2023-09-01 ENCOUNTER — Other Ambulatory Visit (HOSPITAL_COMMUNITY): Payer: Self-pay

## 2023-09-01 DIAGNOSIS — F411 Generalized anxiety disorder: Secondary | ICD-10-CM | POA: Diagnosis not present

## 2023-09-01 DIAGNOSIS — R440 Auditory hallucinations: Secondary | ICD-10-CM

## 2023-09-01 NOTE — ED Notes (Signed)
Pt was given AVS and information for OP services upstairs given belongings out of orange locker

## 2023-09-01 NOTE — ED Provider Notes (Signed)
Behavioral Health Urgent Care Medical Screening Exam  Patient Name: Michelle Velasquez MRN: [id number] Date of Evaluation: 09/01/23 Chief Complaint:  auditory hallucination  Diagnosis:  Final diagnoses:  Auditory hallucination  GAD (generalized anxiety disorder)    History of Present illness: Michelle Velasquez is a 32 y.o. female. With a history of GAD, auditory hallucination, presented to [facility], accompanied by her female partner.  Per the patient she has been having auditory hallucination hearing voices telling her to hurt herself, walk into traffic, stapled her hand and this has been going on for the past 2 months.  According to the patient she was at work today and she heard the voices telling her to get up and stay per her hand.  Per the patient she has been battling with this and never seek help. Per the patient she lives with her partner and at this current moment she is not suicidal but she would like to get resources where she can get in to see a therapist to deal with her thoughts.  According to patient she feels safe returning home because she has her partner to help her her partner helps her with her medicine so she is not triggered to hurt herself when she is around her partner.  According to patient she has had suicidal thoughts from the age of  [age]y/o when asked if she had ever been hospitalized patient stated no patient stated she has never seek any formal family psychiatric help before.  Face-to-face evaluation of patient, patient is alert and oriented x 4, speech is clear, maintaining eye contact.  Upon entering the room patient is observed sitting in the chair her apartment beside her as moral support.  Patient does appear to be a little bit depressed however patient answered all questions appropriately.  According to patient she is not currently suicidal but she had hallucination today while at work today, she heard voices telling her to stab herself with a scissors.  Patient  denies HI, denies paranoia. Pt denies alcohol use, denies smoking or illicit drug use.  According to the patient she is taking medicines for anxiety which is prescribed by her PCP.  According to patient she has no intention to hurt herself or to commit suicide.  Patient stated she feels safe returning home because her partner is there as a support.  Discussed with patient the need to return to the facility should she encounter any suicidal thoughts or if the hallucination continues.  Patient is also advised to call 911 should she feel that the need to coming.  Patient and partner verbalized understanding.  Patient was also given information for walk-in psychiatry and also resources for therapists in the area  Recommend discharge for them to follow-up with outpatient resources provided  Flowsheet Row ED from 09/01/2023 in [facility] ED from 10/14/2022 in [facility] ED from 09/04/2022 in [facility] Emergency Department at [facility]  C-SSRS RISK CATEGORY Moderate Risk No Risk No Risk       Psychiatric Specialty Exam  Presentation  General Appearance:Casual  Eye Contact:Good  Speech:Clear and Coherent  Speech Volume:Normal  Handedness:Right   Mood and Affect  Mood: Anxious  Affect: Appropriate   Thought Process  Thought Processes: Coherent  Descriptions of Associations:Circumstantial  Orientation:Full (Time, Place and Person)  Thought Content:Logical    Hallucinations:Auditory voices telling her to stab herself at work today,  Ideas of Reference:None  Suicidal Thoughts:No (not currently)  Homicidal Thoughts:No  Sensorium  Memory: Immediate Fair  Judgment: Fair  Insight: Good   Executive Functions  Concentration: Good  Attention Span: Good  Recall: Good  Fund of Knowledge: Good  Language: Good   Psychomotor Activity  Psychomotor Activity: Normal   Assets   Assets: Desire for Improvement   Sleep  Sleep: Fair  Number of hours:  6   Physical Exam: Physical Exam HENT:     Head: Normocephalic.     Nose: Nose normal.  Eyes:     Pupils: Pupils are equal, round, and reactive to light.  Cardiovascular:     Rate and Rhythm: Normal rate.  Pulmonary:     Effort: Pulmonary effort is normal.  Musculoskeletal:        General: Normal range of motion.     Cervical back: Normal range of motion.  Neurological:     General: No focal deficit present.     Mental Status: She is alert.  Psychiatric:        Mood and Affect: Mood normal.        Behavior: Behavior normal.        Thought Content: Thought content normal.        Judgment: Judgment normal.   Review of Systems  Constitutional: Negative.   HENT: Negative.    Eyes: Negative.   Respiratory: Negative.    Cardiovascular: Negative.   Gastrointestinal: Negative.   Genitourinary: Negative.   Musculoskeletal: Negative.   Skin: Negative.   Neurological: Negative.   Psychiatric/Behavioral:  Positive for depression and suicidal ideas. The patient is nervous/anxious.    Blood pressure (!) 139/109, pulse 100, temperature 98.9 F (37.2 C), temperature source Oral, resp. rate 19, SpO2 100%. There is no height or weight on file to calculate BMI.  Musculoskeletal: Strength & Muscle Tone: within normal limits Gait & Station: normal Patient leans: N/A   [facility] MSE Discharge Disposition for Follow up and Recommendations: Based on my evaluation the patient does not appear to have an emergency medical condition and can be discharged with resources and follow up care in outpatient services for Individual Therapy   Sindy Guadeloupe, NP 09/01/2023, 9:21 PM

## 2023-09-01 NOTE — Progress Notes (Signed)
   09/01/23 1752  [facility] Triage Screening (Walk-ins at [facility] only)  How Did You Hear About [place]? Family/Friend  What Is the Reason for Your Visit/Call Today? Michelle Velasquez is a 32 year old female presenting to [facility] accompanied by her partner. Pt reports that she has been hearing voices off and on for about 2 months. Pt states, "these voices are telling me to run into traffic to end my life". Pt reports that at age [age] she had tried to commit suicide every birthday. Pt reports she is diagnosed with depression and anxiety. Pt states, "I was at work today and these voices are telling me to stab myself with sissors". Pt denies having a plan to end her life at this time. Pt is taking a long list of medication for her depression and anxiety. Pt does not currently see a therapist at this time. Pt denies substance use, HI and AVH.  How Long Has This Been Causing You Problems? 1-6 months  Have You Recently Had Any Thoughts About Hurting Yourself? Yes  How long ago did you have thoughts about hurting yourself? today  Are You Planning to Commit Suicide/Harm Yourself At This time? No  Have you Recently Had Thoughts About Hurting Someone [name]? No  Are You Planning To Harm Someone At This Time? No  Are you currently experiencing any auditory, visual or other hallucinations? Yes  Please explain the hallucinations you are currently experiencing: today, "run into traffic to end my life"  Have You Used Any Alcohol or Drugs in the Past 24 Hours? No  Do you have any current medical co-morbidities that require immediate attention? No  Clinician description of patient physical appearance/behavior: tearful, anxious  What Do You Feel Would Help You the Most Today? Medication(s);Treatment for Depression or other mood problem;Stress Management  If access to [facility] was not available, would you have sought care in the Emergency Department? No  Determination of Need Urgent (48 hours)  Options For Referral Inpatient  Hospitalization;Intensive Outpatient Therapy;Medication Management

## 2023-09-01 NOTE — Discharge Instructions (Addendum)
F/u with walkin clincic

## 2023-09-01 NOTE — ED Notes (Signed)
Pt just came with family member and put in assessment room 135

## 2023-09-07 ENCOUNTER — Other Ambulatory Visit (HOSPITAL_COMMUNITY): Payer: Self-pay

## 2023-09-07 ENCOUNTER — Encounter: Payer: Self-pay | Admitting: Neurology

## 2023-09-07 ENCOUNTER — Telehealth: Payer: Self-pay | Admitting: Pharmacy Technician

## 2023-09-07 NOTE — Telephone Encounter (Signed)
Pharmacy Patient Advocate Encounter   Received notification from CoverMyMeds that prior authorization for Aimovig is required/requested.   Insurance verification completed.   The patient is insured through [employer] .   Per test claim: PA required; PA submitted to above mentioned insurance via CoverMyMeds Key/confirmation #/EOC [id number] Status is pending

## 2023-09-08 ENCOUNTER — Encounter: Payer: Self-pay | Admitting: Neurology
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
## 2023-09-09 ENCOUNTER — Encounter: Payer: Self-pay | Admitting: Neurology

## 2023-09-09 ENCOUNTER — Other Ambulatory Visit (INDEPENDENT_AMBULATORY_CARE_PROVIDER_SITE_OTHER): Payer: Managed Care, Other (non HMO)

## 2023-09-09 ENCOUNTER — Ambulatory Visit (INDEPENDENT_AMBULATORY_CARE_PROVIDER_SITE_OTHER): Payer: Self-pay | Admitting: Neurology

## 2023-09-09 ENCOUNTER — Other Ambulatory Visit (HOSPITAL_COMMUNITY): Payer: Self-pay

## 2023-09-09 VITALS — BP 154/84 | HR 95 | Ht 64.0 in | Wt 344.8 lb

## 2023-09-09 DIAGNOSIS — R5383 Other fatigue: Secondary | ICD-10-CM | POA: Diagnosis not present

## 2023-09-09 DIAGNOSIS — G43109 Migraine with aura, not intractable, without status migrainosus: Secondary | ICD-10-CM

## 2023-09-09 DIAGNOSIS — G894 Chronic pain syndrome: Secondary | ICD-10-CM | POA: Diagnosis not present

## 2023-09-09 DIAGNOSIS — G35 Multiple sclerosis: Secondary | ICD-10-CM | POA: Diagnosis not present

## 2023-09-09 DIAGNOSIS — F419 Anxiety disorder, unspecified: Secondary | ICD-10-CM

## 2023-09-09 LAB — TSH: TSH: 2.34 u[IU]/mL (ref 0.35–5.50)

## 2023-09-09 LAB — VITAMIN B12: Vitamin B-12: 218 pg/mL (ref 211–911)

## 2023-09-09 LAB — T4, FREE: Free T4: 0.89 ng/dL (ref 0.60–1.60)

## 2023-09-09 MED ORDER — BACLOFEN 10 MG PO TABS
15.0000 mg | ORAL_TABLET | Freq: Three times a day (TID) | ORAL | 5 refills | Status: DC | PRN
  Filled 2023-09-09 – 2023-10-04 (×2): qty 135, 30d supply, fill #0
  Filled 2023-11-23: qty 135, 30d supply, fill #1
  Filled 2023-12-28: qty 135, 30d supply, fill #2
  Filled 2024-02-07: qty 135, 30d supply, fill #3
  Filled 2024-03-02: qty 135, 30d supply, fill #4

## 2023-09-09 MED ORDER — VENLAFAXINE HCL ER 37.5 MG PO CP24
ORAL_CAPSULE | ORAL | 0 refills | Status: DC
  Filled 2023-09-09: qty 90, 60d supply, fill #0

## 2023-09-09 NOTE — Patient Instructions (Addendum)
Check MRI of brain and cervical spine with and without contrast. MRI Exams  Please notify your physician if you have allergies to MRI contrast.  MRI cannot be performed on patients with cardiac pacemakers, some cardiac valves and stents, ear implants, neuro-stimulators and some aneurysm clips.  Bring any information peretaining to stents or implants to your exam.  MRI Brain or Orbit: No eye makeup preferred.  MRI of Abdomen and/or MRCP: No food or drink 4 hours prior.  If you require medication for claustrophobia or anxiety, please bring your medication and a driver with you to the appointment. Upon arrival, tell the receptionist that you have brought your medication. Ask when to take it.   Address: [address], [address]  [place], [place]  Phone: [phone]  Taper off venlafaxine.  Pick up venlafaxine XR 37.5mg  pill.  Take 2 pills every morning with breakfast for 30 days, then 1 pill every morning with breakfast for 30 days then STOP.  Then contact me with update and we can start duloxetine (Cymbalta) Increase baclofen 10mg  tablet to 1.5 tablets 3 times daily as needed Continue gabapentin, modafinil  Check B12, TSH and free T4. Your provider has requested that you have labwork completed today. Please go to [facility] ([id number]) on the second floor of this building before leaving the office today. You do not need to check in. If you are not called within 15 minutes please check with the front desk.   Continue D 50,000 units every 7 days.  In addition, start taking over the counter vitamin D3 1000 units daily. Restart iron supplements Follow up 6 months.  Sooner if needed. Continue Ocrevus.  Decide to change based on MRI results.

## 2023-09-10 ENCOUNTER — Other Ambulatory Visit: Payer: Self-pay

## 2023-09-10 ENCOUNTER — Ambulatory Visit
Admission: RE | Admit: 2023-09-10 | Discharge: 2023-09-10 | Disposition: A | Discharge status: Home / Self Care | Payer: Managed Care, Other (non HMO) | Source: Ambulatory Visit | Attending: Emergency Medicine | Admitting: Emergency Medicine

## 2023-09-10 VITALS — BP 177/85 | HR 107 | Temp 100.0°F | Resp 17

## 2023-09-10 DIAGNOSIS — J020 Streptococcal pharyngitis: Secondary | ICD-10-CM

## 2023-09-10 LAB — POC COVID19/FLU A&B COMBO
Covid Antigen, POC: NEGATIVE
Influenza A Antigen, POC: NEGATIVE
Influenza B Antigen, POC: NEGATIVE

## 2023-09-10 LAB — POCT RAPID STREP A (OFFICE): Rapid Strep A Screen: POSITIVE — AB

## 2023-09-10 MED ORDER — AMOXICILLIN 500 MG PO CAPS: 1000.0000 mg | ORAL_CAPSULE | Freq: Every day | ORAL | 0 refills | Status: AC

## 2023-09-10 NOTE — Discharge Instructions (Addendum)
Your strep test today is positive.  I recommend that you begin antibiotics now for treatment.  I have sent a prescription to your pharmacy.  Please take them as prescribed.  You will begin to feel better in about 24 hours.  Please be sure that you you finish the entire 10-day course of treatment to avoid worsening infection that may require longer treatment with stronger antibiotics.   After 24 hours, please discard your toothbrush as well as any other oral devices that you are currently using and replace them with new ones to avoid reinfection.  Also after 24 hours, you will no longer be contagious.    Your influenza and COVID-19 tests are both negative.   Please read below to learn more about the medications, dosages and frequencies that I recommend to help alleviate your symptoms and to get you feeling better soon:   Amoxicillin:  Please take two (2) capsules daily for 10 days.  This antibiotic can cause upset stomach, this will resolve once antibiotics are complete.  You are welcome to take a probiotic, eat yogurt, take Imodium while taking this medication.  Please avoid other systemic medications such as Maalox, Pepto-Bismol or milk of magnesia as they can interfere with the body's ability to absorb the antibiotics.        Chloraseptic Throat Spray: Spray 5 sprays into affected area every 2 hours, hold for 15 seconds and either swallow or spit it out.  This is a excellent numbing medication because it is a spray, you can put it right where you needed and so sucking on a lozenge and numbing your entire mouth.      If symptoms have not meaningfully improved in the next 5 to 7 days, please return for repeat evaluation or follow-up with your regular provider.  If symptoms have worsened in the next 3 to 5 days, please return for repeat evaluation or follow-up with your regular provider.    Thank you for visiting urgent care today.  We appreciate the opportunity to participate in your care.

## 2023-09-10 NOTE — Progress Notes (Signed)
LMOVM for patient to call back.

## 2023-09-10 NOTE — ED Triage Notes (Signed)
Symptoms started 2 days ago. Initially felt like something was in the back of her throat, reports difficulty swallowing secretions.

## 2023-09-10 NOTE — ED Provider Notes (Signed)
[facility]    CSN: [id number] Arrival date & time: 09/10/23  1754    HISTORY   Chief Complaint  Patient presents with   Cough    Sinus congestion - Entered by patient   HPI Michelle Velasquez is a pleasant, 32 y.o. female who presents to urgent care today. Pt states she began to have a sore throat 2 days ago, is having difficulty swallowing secretions, denies difficulty maintaining airway.  States today she began to have sinus congestion, clear rhinorrhea and nonproductive cough.  Patient denies headache, nausea, vomiting, diarrhea, known sick contacts.  Patient has elevated temperature on arrival with elevated heart rate, blood pressure is also elevated.  Patient has known history of multiple sclerosis currently being followed by neurology.  The history is provided by the patient.   Past Medical History:  Diagnosis Date   Allergy    Anxiety    Depression    Headache    Neuromuscular disorder (HCC)    PCOS (polycystic ovarian syndrome)    Patient Active Problem List   Diagnosis Date Noted   Hyperglycemia 12/17/2022   Iron deficiency anemia due to chronic blood loss 12/17/2022   Severe episode of recurrent major depressive disorder, without psychotic features (HCC) 07/07/2021   Multiple sclerosis (HCC) 12/27/2020   Migraine with aura and without status migrainosus, not intractable 05/16/2020   BMI 60.0-69.9, adult (HCC) 05/16/2020   Dysmenorrhea 05/16/2020   Menorrhagia with irregular cycle 05/16/2020   History reviewed. No pertinent surgical history. OB History     Gravida  0   Para  0   Term  0   Preterm  0   AB  0   Living  0      SAB  0   IAB  0   Ectopic  0   Multiple  0   Live Births  0          Home Medications    Prior to Admission medications   Medication Sig Start Date End Date Taking? Authorizing Provider  baclofen (LIORESAL) 10 MG tablet Take 1.5 tablets (15 mg total) by mouth 3 (three) times daily as needed for muscle  spasms. 09/09/23   Everlena Cooper, Adam R, DO  busPIRone (BUSPAR) 7.5 MG tablet Take 1 tablet (7.5 mg total) by mouth 2 (two) times daily. 12/17/22   Nche, Bonna Gains, NP  Cholecalciferol (VITAMIN D3) 1.25 MG (50000 UT) CAPS Take 1 capsule (50,000 Units total) by mouth every 7 (seven) days. 01/01/23   Drema Dallas, DO  Cholecalciferol 1.25 MG (50000 UT) capsule Take 1 capsule (50,000 Units total) by mouth every 7 (seven) days. 11/18/22   Everlena Cooper, Adam R, DO  Erenumab-aooe (AIMOVIG) 140 MG/ML SOAJ Inject 140 mg (1 ml) into the skin every 30 days. 11/18/22   Drema Dallas, DO  gabapentin (NEURONTIN) 300 MG capsule Take 3 capsules (900 mg total) by mouth 3 (three) times daily. 03/04/23   Drema Dallas, DO  Iron, Ferrous Sulfate, 325 (65 Fe) MG TABS Take 325 mg by mouth daily. With food Patient not taking: Reported on 09/09/2023 12/24/22   Nche, Bonna Gains, NP  modafinil (PROVIGIL) 200 MG tablet Take 1 tablet (200 mg total) by mouth in the morning. 03/04/23   Everlena Cooper, Adam R, DO  ocrelizumab (OCREVUS) 300 MG/10ML injection Inject 600 mg into the vein once.    Everlena Cooper, Adam R, DO  ondansetron (ZOFRAN) 4 MG tablet Take 1-2 tablets (4-8 mg total) by mouth every 8 (  eight) hours as needed. 01/01/23   Drema Dallas, DO  rizatriptan (MAXALT-MLT) 10 MG disintegrating tablet Dissolve 1 tablet (10 mg total) by mouth as needed for migraine. May repeat in 2 hours if needed.  Maximum 2 tablets in 24 hours. 08/20/22   Drema Dallas, DO  venlafaxine XR (EFFEXOR XR) 37.5 MG 24 hr capsule Take 2 capsules (75 mg total) by mouth daily with breakfast for 30 days, THEN 1 capsule (37.5 mg total) daily with breakfast, THEN stop. 09/09/23 11/09/23  Drema Dallas, DO    Family History Family History  Problem Relation Age of Onset   Diabetes Father    Multiple sclerosis Mother    Fibroids Mother    Fibromyalgia Mother    Social History Social History   Tobacco Use   Smoking status: Never   Smokeless tobacco: Never  Vaping Use   Vaping  status: Never Used  Substance Use Topics   Alcohol use: Never   Drug use: Never   Allergies   Sulfa antibiotics, Gadolinium derivatives, and Grass pollen(k-o-r-t-swt vern)  Review of Systems Review of Systems Pertinent findings revealed after performing a 14 point review of systems has been noted in the history of present illness.  Physical Exam Vital Signs BP (!) 177/85 (BP Location: Right Arm)   Pulse (!) 107   Temp 100 F (37.8 C) (Oral)   Resp 17   SpO2 95%   No data found.  Physical Exam  Visual Acuity Right Eye Distance:   Left Eye Distance:   Bilateral Distance:    Right Eye Near:   Left Eye Near:    Bilateral Near:     [facility] Couse / Diagnostics / Procedures:     Radiology No results found.  Procedures Procedures (including critical care time) EKG  Pending results:  Labs Reviewed  POCT RAPID STREP A (OFFICE) - Abnormal; Notable for the following components:      Result Value   Rapid Strep A Screen Positive (*)    All other components within normal limits  POC COVID19/FLU A&B COMBO    Medications Ordered in UC: Medications - No data to display  UC Diagnoses / Final Clinical Impressions(s)   I have reviewed the triage vital signs and the nursing notes.  Pertinent labs & imaging results that were available during my care of the patient were reviewed by me and considered in my medical decision making (see chart for details).    Final diagnoses:  Acute streptococcal pharyngitis   Rapid strep test today is positive.  Will treat patient with a 10-day course of amoxicillin 1 g/day.  With influenza and COVID-19 tests are negative, patient advised.  Conservative care recommended.  Return precautions advised.  Please see discharge instructions below for details of plan of care as provided to patient. ED Prescriptions     Medication Sig Dispense Auth. Provider   amoxicillin (AMOXIL) 500 MG capsule Take 2 capsules (1,000 mg total) by mouth daily for 10  days. 20 capsule Theadora Rama Scales, PA-C      PDMP not reviewed this encounter.  Pending results:  Labs Reviewed  POCT RAPID STREP A (OFFICE) - Abnormal; Notable for the following components:      Result Value   Rapid Strep A Screen Positive (*)    All other components within normal limits  POC COVID19/FLU A&B COMBO    Discharge Instructions:   Discharge Instructions      Your strep test today is positive.  I  recommend that you begin antibiotics now for treatment.  I have sent a prescription to your pharmacy.  Please take them as prescribed.  You will begin to feel better in about 24 hours.  Please be sure that you you finish the entire 10-day course of treatment to avoid worsening infection that may require longer treatment with stronger antibiotics.   After 24 hours, please discard your toothbrush as well as any other oral devices that you are currently using and replace them with new ones to avoid reinfection.  Also after 24 hours, you will no longer be contagious.    Your influenza and COVID-19 tests are both negative.   Please read below to learn more about the medications, dosages and frequencies that I recommend to help alleviate your symptoms and to get you feeling better soon:   Amoxicillin:  Please take two (2) capsules daily for 10 days.  This antibiotic can cause upset stomach, this will resolve once antibiotics are complete.  You are welcome to take a probiotic, eat yogurt, take Imodium while taking this medication.  Please avoid other systemic medications such as Maalox, Pepto-Bismol or milk of magnesia as they can interfere with the body's ability to absorb the antibiotics.        Chloraseptic Throat Spray: Spray 5 sprays into affected area every 2 hours, hold for 15 seconds and either swallow or spit it out.  This is a excellent numbing medication because it is a spray, you can put it right where you needed and so sucking on a lozenge and numbing your entire mouth.       If symptoms have not meaningfully improved in the next 5 to 7 days, please return for repeat evaluation or follow-up with your regular provider.  If symptoms have worsened in the next 3 to 5 days, please return for repeat evaluation or follow-up with your regular provider.    Thank you for visiting urgent care today.  We appreciate the opportunity to participate in your care.       Disposition Upon Discharge:  Condition: stable for discharge home  Patient presented with an acute illness with associated systemic symptoms and significant discomfort requiring urgent management. In my opinion, this is a condition that a prudent lay person (someone who possesses an average knowledge of health and medicine) may potentially expect to result in complications if not addressed urgently such as respiratory distress, impairment of bodily function or dysfunction of bodily organs.   Routine symptom specific, illness specific and/or disease specific instructions were discussed with the patient and/or caregiver at length.   As such, the patient has been evaluated and assessed, work-up was performed and treatment was provided in alignment with urgent care protocols and evidence based medicine.  Patient/parent/caregiver has been advised that the patient may require follow up for further testing and treatment if the symptoms continue in spite of treatment, as clinically indicated and appropriate.  Patient/parent/caregiver has been advised to return to the [facility] or PCP if no better; to PCP or the Emergency Department if new signs and symptoms develop, or if the current signs or symptoms continue to change or worsen for further workup, evaluation and treatment as clinically indicated and appropriate  The patient will follow up with their current PCP if and as advised. If the patient does not currently have a PCP we will assist them in obtaining one.   The patient may need specialty follow up if the symptoms  continue, in spite of conservative treatment and  management, for further workup, evaluation, consultation and treatment as clinically indicated and appropriate.  Patient/parent/caregiver verbalized understanding and agreement of plan as discussed.  All questions were addressed during visit.  Please see discharge instructions below for further details of plan.  This office note has been dictated using Teaching laboratory technician.  Unfortunately, this method of dictation can sometimes lead to typographical or grammatical errors.  I apologize for your inconvenience in advance if this occurs.  Please do not hesitate to reach out to me if clarification is needed.      Theadora Rama Scales, [place] 09/10/23 [phone]

## 2023-09-13 ENCOUNTER — Other Ambulatory Visit: Payer: Self-pay

## 2023-09-13 ENCOUNTER — Telehealth: Payer: Self-pay | Admitting: Pharmacy Technician

## 2023-09-13 DIAGNOSIS — G35 Multiple sclerosis: Secondary | ICD-10-CM

## 2023-09-13 NOTE — Addendum Note (Signed)
Addended by: Leida Lauth on: 09/13/2023 09:37 AM   Modules accepted: Orders

## 2023-09-13 NOTE — Progress Notes (Signed)
See results note,09/10/23

## 2023-09-13 NOTE — Telephone Encounter (Addendum)
 NEW INSURANCE  Auth Submission: APPROVED Site of care: Site of care: [facility] Payer: [employer] Medication & CPT/J Code(s) submitted: Michelle Velasquez) [phone] Route of submission (phone, fax, portal):  Phone # Fax # Auth type: [facility]ARM[facility]Y - [facility] Units/visits requeste 600mg  q6 months (2 doses) Reference number: [id number] Approval from: 114/25/24 to 10/12/24

## 2023-09-15 ENCOUNTER — Other Ambulatory Visit (HOSPITAL_COMMUNITY): Payer: Self-pay

## 2023-09-20 ENCOUNTER — Other Ambulatory Visit (HOSPITAL_COMMUNITY): Payer: Self-pay

## 2023-09-20 ENCOUNTER — Telehealth: Payer: Self-pay

## 2023-09-20 MED ORDER — OCREVUS 300 MG/10ML IV SOLN: 600.0000 mg | INTRAVENOUS | 1 refills | Status: AC

## 2023-09-20 NOTE — Telephone Encounter (Signed)
Dr. Allena Katz, [facility] is requesting a new prescription for Ocrevus. The request form has been scanned under the media tab. Thank you, Dot Lanes

## 2023-09-20 NOTE — Telephone Encounter (Signed)
Pharmacy Patient Advocate Encounter  Received notification from [employer] that Prior Authorization for Aimovig 140MG /ML auto-injectors has been APPROVED from 09/07/23 to 09/05/24 . Filled 09/09/23. Next available fill date 09/23/23   PA #/Case ID/Reference #: PA Case ID #: [id number]

## 2023-09-20 NOTE — Telephone Encounter (Signed)
Ocrevus 600 mg every 6 months sent tot [facility] as requested.

## 2023-09-20 NOTE — Telephone Encounter (Signed)
Michelle Velasquez, Patient will need new script for Ocrevus.  Please send script [facility].  Script request form has been scanned to the media tab.  Thanks Selena Batten

## 2023-09-20 NOTE — Addendum Note (Signed)
Addended by: Leida Lauth on: 09/20/2023 10:15 AM   Modules accepted: Orders

## 2023-10-01 ENCOUNTER — Ambulatory Visit: Payer: 59

## 2023-10-04 ENCOUNTER — Other Ambulatory Visit (HOSPITAL_COMMUNITY): Payer: Self-pay

## 2023-10-04 ENCOUNTER — Other Ambulatory Visit: Payer: Self-pay

## 2023-10-11 ENCOUNTER — Other Ambulatory Visit (HOSPITAL_COMMUNITY): Payer: Self-pay

## 2023-10-15 ENCOUNTER — Ambulatory Visit (INDEPENDENT_AMBULATORY_CARE_PROVIDER_SITE_OTHER): Payer: Managed Care, Other (non HMO)

## 2023-10-15 VITALS — BP 146/77 | HR 96 | Temp 99.1°F | Resp 18 | Ht 64.0 in | Wt 343.8 lb

## 2023-10-15 DIAGNOSIS — G35 Multiple sclerosis: Secondary | ICD-10-CM

## 2023-10-15 MED ORDER — DIPHENHYDRAMINE HCL 25 MG PO CAPS
50.0000 mg | ORAL_CAPSULE | Freq: Once | ORAL | Status: AC
Start: 2023-10-15 — End: 2023-10-15
  Administered 2023-10-15: 50 mg via ORAL
  Filled 2023-10-15: qty 2

## 2023-10-15 MED ORDER — ACETAMINOPHEN 325 MG PO TABS
650.0000 mg | ORAL_TABLET | Freq: Once | ORAL | Status: AC
  Administered 2023-10-15: 650 mg via ORAL
  Filled 2023-10-15: qty 2

## 2023-10-15 MED ORDER — SODIUM CHLORIDE 0.9 % IV SOLN
600.0000 mg | Freq: Once | INTRAVENOUS | Status: AC
  Administered 2023-10-15: 600 mg via INTRAVENOUS
  Filled 2023-10-15: qty 20

## 2023-10-15 MED ORDER — METHYLPREDNISOLONE SODIUM SUCC 125 MG IJ SOLR
125.0000 mg | Freq: Once | INTRAMUSCULAR | Status: AC
  Administered 2023-10-15: 125 mg via INTRAVENOUS
  Filled 2023-10-15: qty 2

## 2023-10-15 NOTE — Progress Notes (Signed)
Diagnosis: Multiple Sclerosis  Provider:  Chilton Greathouse MD  Procedure: IV Infusion  IV Type: Peripheral, IV Location: L Antecubital  Ocrevus (Ocrelizumab), Dose: 600 mg  Infusion Start Time: 0933  Infusion Stop Time: 1357  Post Infusion IV Care: Observation period completed and Peripheral IV Discontinued  Discharge: Condition: Good, Destination: Home . AVS Provided  Performed by:  Loney Hering, LPN

## 2023-10-21 ENCOUNTER — Other Ambulatory Visit (HOSPITAL_COMMUNITY): Payer: Self-pay

## 2023-10-25 ENCOUNTER — Other Ambulatory Visit: Payer: Self-pay

## 2023-10-25 ENCOUNTER — Other Ambulatory Visit (HOSPITAL_COMMUNITY): Payer: Self-pay

## 2023-10-25 ENCOUNTER — Emergency Department (HOSPITAL_COMMUNITY)
Admission: EM | Admit: 2023-10-25 | Discharge: 2023-10-25 | Disposition: A | Discharge status: Home / Self Care | Payer: Managed Care, Other (non HMO) | Attending: Emergency Medicine | Admitting: Emergency Medicine

## 2023-10-25 ENCOUNTER — Encounter (HOSPITAL_COMMUNITY): Payer: Self-pay | Admitting: Emergency Medicine

## 2023-10-25 ENCOUNTER — Emergency Department (HOSPITAL_COMMUNITY): Payer: Managed Care, Other (non HMO)

## 2023-10-25 DIAGNOSIS — S0990XA Unspecified injury of head, initial encounter: Secondary | ICD-10-CM | POA: Diagnosis present

## 2023-10-25 DIAGNOSIS — M25561 Pain in right knee: Secondary | ICD-10-CM | POA: Diagnosis not present

## 2023-10-25 DIAGNOSIS — Y92410 Unspecified street and highway as the place of occurrence of the external cause: Secondary | ICD-10-CM | POA: Insufficient documentation

## 2023-10-25 DIAGNOSIS — S022XXA Fracture of nasal bones, initial encounter for closed fracture: Secondary | ICD-10-CM | POA: Insufficient documentation

## 2023-10-25 LAB — I-STAT CHEM 8, ED
BUN: 6 mg/dL (ref 6–20)
Calcium, Ion: 1.19 mmol/L (ref 1.15–1.40)
Chloride: 102 mmol/L (ref 98–111)
Creatinine, Ser: 0.6 mg/dL (ref 0.44–1.00)
Glucose, Bld: 106 mg/dL — ABNORMAL HIGH (ref 70–99)
HCT: 34 % — ABNORMAL LOW (ref 36.0–46.0)
Hemoglobin: 11.6 g/dL — ABNORMAL LOW (ref 12.0–15.0)
Potassium: 4 mmol/L (ref 3.5–5.1)
Sodium: 136 mmol/L (ref 135–145)
TCO2: 24 mmol/L (ref 22–32)

## 2023-10-25 MED ORDER — METHOCARBAMOL 500 MG PO TABS
500.0000 mg | ORAL_TABLET | Freq: Two times a day (BID) | ORAL | 0 refills | Status: DC | PRN
  Filled 2023-10-25: qty 20, 10d supply, fill #0

## 2023-10-25 MED ORDER — MELOXICAM 15 MG PO TABS
15.0000 mg | ORAL_TABLET | Freq: Every day | ORAL | 0 refills | Status: DC
  Filled 2023-10-25: qty 14, 14d supply, fill #0

## 2023-10-25 MED ORDER — METHOCARBAMOL 500 MG PO TABS
500.0000 mg | ORAL_TABLET | Freq: Two times a day (BID) | ORAL | 0 refills | Status: DC | PRN
Start: 2023-10-25 — End: 2023-10-25

## 2023-10-25 MED ORDER — IOHEXOL 350 MG/ML SOLN
100.0000 mL | Freq: Once | INTRAVENOUS | Status: AC | PRN
  Administered 2023-10-25: 100 mL via INTRAVENOUS

## 2023-10-25 MED ORDER — MELOXICAM 15 MG PO TABS: 15.0000 mg | ORAL_TABLET | Freq: Every day | ORAL | 0 refills | Status: DC

## 2023-10-25 MED ORDER — ACETAMINOPHEN 500 MG PO TABS
1000.0000 mg | ORAL_TABLET | Freq: Once | ORAL | Status: AC
  Administered 2023-10-25: 1000 mg via ORAL
  Filled 2023-10-25: qty 2

## 2023-10-25 NOTE — Discharge Instructions (Signed)
 It appears that you do have a broken nose, this should heal by itself but if you are unsatisfied with the amount of swelling or the appearance of the nose you can always follow-up with an ear nose and throat doctor.  It does not appear to be anything that should cause long-term disability or deformity however it will cause some short-term bruising swelling and difficulty breathing through the nose.  Please avoid blowing your nose, use Afrin nasal spray as needed, Mobic  once a day for pain, ice packs to help with the swelling.  Have your family doctor recheck you within 3 days  Thank you for allowing us  to treat you in the emergency department today.  After reviewing your examination and potential testing that was done it appears that you are safe to go home.  I would like for you to follow-up with your doctor within the next several days, have them obtain your records and follow-up with them to review all potential tests and results from your visit.  If you should develop severe or worsening symptoms return to the emergency department immediately
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
## 2023-10-26 ENCOUNTER — Encounter: Payer: Self-pay | Admitting: Neurology

## 2023-10-27 ENCOUNTER — Encounter: Payer: Self-pay | Admitting: Nurse Practitioner

## 2023-10-27 ENCOUNTER — Ambulatory Visit: Payer: Managed Care, Other (non HMO) | Admitting: Nurse Practitioner

## 2023-10-27 ENCOUNTER — Telehealth: Payer: Self-pay

## 2023-10-27 ENCOUNTER — Telehealth: Payer: Managed Care, Other (non HMO) | Admitting: Nurse Practitioner

## 2023-10-27 DIAGNOSIS — S022XXA Fracture of nasal bones, initial encounter for closed fracture: Secondary | ICD-10-CM

## 2023-10-27 DIAGNOSIS — M79641 Pain in right hand: Secondary | ICD-10-CM

## 2023-10-27 DIAGNOSIS — M25531 Pain in right wrist: Secondary | ICD-10-CM | POA: Diagnosis not present

## 2023-10-27 NOTE — Transitions of Care (Post Inpatient/ED Visit) (Signed)
 10/27/2023  Name: Michelle Velasquez MRN: [id number] DOB: 1991-03-09  Today's TOC FU Call Status: Today's TOC FU Call Status:: Successful TOC FU Call Completed TOC FU Call Complete Date: 10/27/23 Patient's Name and Date of Birth confirmed.  Transition Care Management Follow-up Telephone Call Date of Discharge: 10/25/23 Discharge Facility: [facility]) Type of Discharge: Emergency Department How have you been since you were released from the hospital?: Same Any questions or concerns?: No  Items Reviewed: Did you receive and understand the discharge instructions provided?: Yes Medications obtained,verified, and reconciled?: Yes (Medications Reviewed) Any new allergies since your discharge?: No Dietary orders reviewed?: No Do you have support at home?: Yes People in Home: spouse  Medications Reviewed Today: Medications Reviewed Today     Reviewed by Lafe Flonnie HERO, CMA (Certified Medical Assistant) on 10/27/23 at 0920  Med List Status: <None>   Medication Order Taking? Sig Documenting Provider Last Dose Status Informant  baclofen  (LIORESAL ) 10 MG tablet [id number] Yes Take 1.5 tablets (15 mg total) by mouth 3 (three) times daily as needed for muscle spasms.  Patient taking differently: Take 15 mg by mouth 3 (three) times daily.   Skeet Juliene SAUNDERS, DO Taking Active Self, Spouse/Significant Other, Pharmacy Records  busPIRone  (BUSPAR ) 7.5 MG tablet [id number] Yes Take 1 tablet (7.5 mg total) by mouth 2 (two) times daily.  Patient taking differently: Take 7.5 mg by mouth See admin instructions. Take 1 tablet (7.5mg ) twice daily, at lunch and bedtime.   Nche, Roselie Rockford, NP Taking Active Self, Spouse/Significant Other, Pharmacy Records  Cholecalciferol  (VITAMIN D -3) 25 MCG (1000 UT) CAPS [id number] Yes Take 1,000 Units by mouth at bedtime. [provider] Taking Active Self, Spouse/Significant Other, Pharmacy Records           Med Note (COFFELL, JON HERO Kitchens Oct 25, 2023 12:16  PM) Taking in addition to weekly dose.  Cholecalciferol  (VITAMIN D3) 1.25 MG (50000 UT) CAPS [id number] Yes Take 1 capsule (50,000 Units total) by mouth every 7 (seven) days.  Patient taking differently: Take 50,000 Units by mouth every Sunday.   Skeet Juliene SAUNDERS, DO Taking Active Self, Spouse/Significant Other, Pharmacy Records  docusate sodium (COLACE) 100 MG capsule [id number] Yes Take 100 mg by mouth at bedtime. [provider] Taking Active Self, Spouse/Significant Other, Pharmacy Records  Erenumab -aooe (AIMOVIG ) 140 MG/ML SOAJ [id number] Yes Inject 140 mg (1 ml) into the skin every 30 days. Skeet Juliene SAUNDERS, DO Taking Active Self, Spouse/Significant Other, Pharmacy Records  gabapentin  (NEURONTIN ) 300 MG capsule [id number] Yes Take 3 capsules (900 mg total) by mouth 3 (three) times daily. Skeet Juliene SAUNDERS, DO Taking Active Self, Spouse/Significant Other, Pharmacy Records  Iron , Ferrous Sulfate , 325 (65 Fe) MG TABS [id number] Yes Take 325 mg by mouth daily. With food  Patient taking differently: Take 325 mg by mouth every Sunday.   Nche, Roselie Rockford, NP Taking Active Self, Spouse/Significant Other, Pharmacy Records  meloxicam  (MOBIC ) 15 MG tablet [id number] Yes Take 1 tablet (15 mg total) by mouth daily for 14 days. Cleotilde Rogue, MD Taking Active   methocarbamol  (ROBAXIN ) 500 MG tablet [id number] Yes Take 1 tablet (500 mg total) by mouth 2 (two) times daily as needed for muscle spasms. Cleotilde Rogue, MD Taking Active   modafinil  (PROVIGIL ) 200 MG tablet [id number] Yes Take 1 tablet (200 mg total) by mouth in the morning. Skeet Juliene SAUNDERS, DO Taking Active Self, Spouse/Significant Other, Pharmacy Records  ocrelizumab  (OCREVUS ) 300 MG/10ML injection [id number] Yes Inject 20 mLs (600  mg total) into the vein every 6 (six) months. Skeet Juliene SAUNDERS, DO Taking Active Self, Spouse/Significant Other, Pharmacy Records  ondansetron  (ZOFRAN ) 4 MG tablet [id number] Yes Take 1-2 tablets (4-8 mg total) by mouth every 8  (eight) hours as needed. Skeet Juliene SAUNDERS, DO Taking Active Self, Spouse/Significant Other, Pharmacy Records  rizatriptan  (MAXALT -MLT) 10 MG disintegrating tablet [id number] Yes Dissolve 1 tablet (10 mg total) by mouth as needed for migraine. May repeat in 2 hours if needed.  Maximum 2 tablets in 24 hours. Skeet Juliene SAUNDERS, DO Taking Active Self, Spouse/Significant Other, Pharmacy Records           Med Note (COFFELL, JON CHRISTELLA Kitchens Oct 25, 2023 12:14 PM) Unknown last dose, over 30 days.  venlafaxine  XR (EFFEXOR  XR) 37.5 MG 24 hr capsule [id number] Yes Take 2 capsules (75 mg total) by mouth daily with breakfast for 30 days, THEN 1 capsule (37.5 mg total) daily with breakfast, THEN stop. Skeet Juliene SAUNDERS, DO Taking Active Self, Spouse/Significant Other, Pharmacy Records  venlafaxine  XR (EFFEXOR -XR) 75 MG 24 hr capsule [id number] Yes Take 75 mg by mouth at bedtime. [provider] Taking Active Self, Spouse/Significant Other, Pharmacy Records            Home Care and Equipment/Supplies: Were Home Health Services Ordered?: No Any new equipment or medical supplies ordered?: No  Functional Questionnaire: Do you need assistance with bathing/showering or dressing?: No Do you need assistance with meal preparation?: No Do you need assistance with eating?: No Do you have difficulty maintaining continence: No Do you need assistance with getting out of bed/getting out of a chair/moving?: No Do you have difficulty managing or taking your medications?: No  Follow up appointments reviewed: PCP Follow-up appointment confirmed?: Yes Date of PCP follow-up appointment?: 10/27/23 Follow-up Provider: Roselie Mood, NP [facility] Follow-up appointment confirmed?: No Do you need transportation to your follow-up appointment?: No Do you understand care options if your condition(s) worsen?: Yes-patient verbalized understanding    SIGNATURE Candise Norse, CMA
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
## 2023-10-29 ENCOUNTER — Telehealth: Payer: Managed Care, Other (non HMO) | Admitting: Nurse Practitioner

## 2023-11-03 ENCOUNTER — Encounter: Payer: Self-pay | Admitting: Nurse Practitioner

## 2023-11-03 ENCOUNTER — Other Ambulatory Visit (HOSPITAL_COMMUNITY): Payer: Self-pay

## 2023-11-03 ENCOUNTER — Ambulatory Visit: Payer: Managed Care, Other (non HMO) | Admitting: Nurse Practitioner

## 2023-11-03 DIAGNOSIS — M79641 Pain in right hand: Secondary | ICD-10-CM

## 2023-11-03 DIAGNOSIS — Z6841 Body Mass Index (BMI) 40.0 and over, adult: Secondary | ICD-10-CM

## 2023-11-03 DIAGNOSIS — G43109 Migraine with aura, not intractable, without status migrainosus: Secondary | ICD-10-CM

## 2023-11-03 DIAGNOSIS — M25531 Pain in right wrist: Secondary | ICD-10-CM | POA: Diagnosis not present

## 2023-11-03 DIAGNOSIS — M25561 Pain in right knee: Secondary | ICD-10-CM

## 2023-11-03 DIAGNOSIS — I10 Essential (primary) hypertension: Secondary | ICD-10-CM

## 2023-11-03 DIAGNOSIS — R030 Elevated blood-pressure reading, without diagnosis of hypertension: Secondary | ICD-10-CM

## 2023-11-03 HISTORY — DX: Essential (primary) hypertension: I10

## 2023-11-03 MED ORDER — MELOXICAM 15 MG PO TABS
15.0000 mg | ORAL_TABLET | Freq: Every day | ORAL | 0 refills | Status: DC
  Filled 2023-11-03: qty 30, 30d supply, fill #0

## 2023-11-03 MED ORDER — METHOCARBAMOL 500 MG PO TABS
500.0000 mg | ORAL_TABLET | Freq: Two times a day (BID) | ORAL | 0 refills | Status: DC | PRN
  Filled 2023-11-03: qty 30, 15d supply, fill #0
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
## 2023-11-03 NOTE — Assessment & Plan Note (Signed)
 Advised to schedule f/up with neurology.
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
## 2023-11-03 NOTE — Patient Instructions (Addendum)
 Schedule appointment with neurology. Add tylenol  650mg  BID Maintain mobic  dose Bring BP readings to next appointment  DASH Eating Plan DASH stands for Dietary Approaches to Stop Hypertension. The DASH eating plan is a healthy eating plan that has been shown to: Lower high blood pressure (hypertension). Reduce your risk for type 2 diabetes, heart disease, and stroke. Help with weight loss. What are tips for following this plan? Reading food labels Check food labels for the amount of salt (sodium) per serving. Choose foods with less than 5 percent of the Daily Value (DV) of sodium. In general, foods with less than 300 milligrams (mg) of sodium per serving fit into this eating plan. To find whole grains, look for the word "whole" as the first word in the ingredient list. Shopping Buy products labeled as "low-sodium" or "no salt added." Buy fresh foods. Avoid canned foods and pre-made or frozen meals. Cooking Try not to add salt when you cook. Use salt-free seasonings or herbs instead of table salt or sea salt. Check with your health care provider or pharmacist before using salt substitutes. Do not fry foods. Cook foods in healthy ways, such as baking, boiling, grilling, roasting, or broiling. Cook using oils that are good for your heart. These include olive, canola, avocado, soybean, and sunflower oil. Meal planning  Eat a balanced diet. This should include: 4 or more servings of fruits and 4 or more servings of vegetables each day. Try to fill half of your plate with fruits and vegetables. 6-8 servings of whole grains each day. 6 or less servings of lean meat, poultry, or fish each day. 1 oz is 1 serving. A 3 oz (85 g) serving of meat is about the same size as the palm of your hand. One egg is 1 oz (28 g). 2-3 servings of low-fat dairy each day. One serving is 1 cup (237 mL). 1 serving of nuts, seeds, or beans 5 times each week. 2-3 servings of heart-healthy fats. Healthy fats called  omega-3 fatty acids are found in foods such as walnuts, flaxseeds, fortified milks, and eggs. These fats are also found in cold-water fish, such as sardines, salmon, and mackerel. Limit how much you eat of: Canned or prepackaged foods. Food that is high in trans fat, such as fried foods. Food that is high in saturated fat, such as fatty meat. Desserts and other sweets, sugary drinks, and other foods with added sugar. Full-fat dairy products. Do not salt foods before eating. Do not eat more than 4 egg yolks a week. Try to eat at least 2 vegetarian meals a week. Eat more home-cooked food and less restaurant, buffet, and fast food. Lifestyle When eating at a restaurant, ask if your food can be made with less salt or no salt. If you drink alcohol: Limit how much you have to: 0-1 drink a day if you are female. 0-2 drinks a day if you are female. Know how much alcohol is in your drink. In the [place] one drink is one 12 oz bottle of beer (355 mL), one 5 oz glass of wine (148 mL), or one 1 oz glass of hard liquor (44 mL). General information Avoid eating more than 2,300 mg of salt a day. If you have hypertension, you may need to reduce your sodium intake to 1,500 mg a day. Work with your provider to stay at a healthy body weight or lose weight. Ask what the best weight range is for you. On most days of the week, get at  least 30 minutes of exercise that causes your heart to beat faster. This may include walking, swimming, or biking. Work with your provider or dietitian to adjust your eating plan to meet your specific calorie needs. What foods should I eat? Fruits All fresh, dried, or frozen fruit. Canned fruits that are in their natural juice and do not have sugar added to them. Vegetables Fresh or frozen vegetables that are raw, steamed, roasted, or grilled. Low-sodium or reduced-sodium tomato and vegetable juice. Low-sodium or reduced-sodium tomato sauce and tomato paste. Low-sodium or  reduced-sodium canned vegetables. Grains Whole-grain or whole-wheat bread. Whole-grain or whole-wheat pasta. Brown rice. Dwyane Glad. Bulgur. Whole-grain and low-sodium cereals. Pita bread. Low-fat, low-sodium crackers. Whole-wheat flour tortillas. Meats and other proteins Skinless chicken or [place]. Ground chicken or [place]. Pork with fat trimmed off. Fish and seafood. Egg whites. Dried beans, peas, or lentils. Unsalted nuts, nut butters, and seeds. Unsalted canned beans. Lean cuts of beef with fat trimmed off. Low-sodium, lean precooked or cured meat, such as sausages or meat loaves. Dairy Low-fat (1%) or fat-free (skim) milk. Reduced-fat, low-fat, or fat-free cheeses. Nonfat, low-sodium ricotta or cottage cheese. Low-fat or nonfat yogurt. Low-fat, low-sodium cheese. Fats and oils Soft margarine without trans fats. Vegetable oil. Reduced-fat, low-fat, or light mayonnaise and salad dressings (reduced-sodium). Canola, safflower, olive, avocado, soybean, and sunflower oils. Avocado. Seasonings and condiments Herbs. Spices. Seasoning mixes without salt. Other foods Unsalted popcorn and pretzels. Fat-free sweets. The items listed above may not be all the foods and drinks you can have. Talk to a dietitian to learn more. What foods should I avoid? Fruits Canned fruit in a light or heavy syrup. Fried fruit. Fruit in cream or butter sauce. Vegetables Creamed or fried vegetables. Vegetables in a cheese sauce. Regular canned vegetables that are not marked as low-sodium or reduced-sodium. Regular canned tomato sauce and paste that are not marked as low-sodium or reduced-sodium. Regular tomato and vegetable juices that are not marked as low-sodium or reduced-sodium. Vanessa General. Olives. Grains Baked goods made with fat, such as croissants, muffins, or some breads. Dry pasta or rice meal packs. Meats and other proteins Fatty cuts of meat. Ribs. Fried meat. Helene Loader. Bologna, salami, and other precooked or  cured meats, such as sausages or meat loaves, that are not lean and low in sodium. Fat from the back of a pig (fatback). Bratwurst. Salted nuts and seeds. Canned beans with added salt. Canned or smoked fish. Whole eggs or egg yolks. Chicken or [place] with skin. Dairy Whole or 2% milk, cream, and half-and-half. Whole or full-fat cream cheese. Whole-fat or sweetened yogurt. Full-fat cheese. Nondairy creamers. Whipped toppings. Processed cheese and cheese spreads. Fats and oils Butter. Stick margarine. Lard. Shortening. Ghee. Bacon fat. Tropical oils, such as coconut, palm kernel, or palm oil. Seasonings and condiments Onion salt, garlic salt, seasoned salt, table salt, and sea salt. Worcestershire sauce. Tartar sauce. Barbecue sauce. Teriyaki sauce. Soy sauce, including reduced-sodium soy sauce. Steak sauce. Canned and packaged gravies. Fish sauce. Oyster sauce. Cocktail sauce. Store-bought horseradish. Ketchup. Mustard. Meat flavorings and tenderizers. Bouillon cubes. Hot sauces. Pre-made or packaged marinades. Pre-made or packaged taco seasonings. Relishes. Regular salad dressings. Other foods Salted popcorn and pretzels. The items listed above may not be all the foods and drinks you should avoid. Talk to a dietitian to learn more. Where to find more information National Heart, Lung, and Blood Institute (NHLBI): BuffaloDryCleaner.gl American Heart Association (AHA): heart.org Academy of Nutrition and Dietetics: eatright.org National Kidney Foundation (NKF): kidney.org  This information is not intended to replace advice given to you by your health care provider. Make sure you discuss any questions you have with your health care provider. Document Revised: 10/22/2022 Document Reviewed: 10/22/2022 Elsevier Patient Education  2024 [employer].

## 2023-11-05 ENCOUNTER — Encounter: Payer: Self-pay | Admitting: Nurse Practitioner

## 2023-11-05 DIAGNOSIS — M25531 Pain in right wrist: Secondary | ICD-10-CM
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
## 2023-11-10 DIAGNOSIS — Z0279 Encounter for issue of other medical certificate: Secondary | ICD-10-CM

## 2023-11-11 ENCOUNTER — Encounter: Payer: Self-pay | Admitting: Nurse Practitioner

## 2023-11-11 ENCOUNTER — Ambulatory Visit: Payer: Managed Care, Other (non HMO) | Admitting: Nurse Practitioner

## 2023-11-11 ENCOUNTER — Other Ambulatory Visit (HOSPITAL_COMMUNITY): Payer: Self-pay

## 2023-11-11 DIAGNOSIS — F411 Generalized anxiety disorder: Secondary | ICD-10-CM

## 2023-11-11 DIAGNOSIS — F330 Major depressive disorder, recurrent, mild: Secondary | ICD-10-CM

## 2023-11-11 DIAGNOSIS — Z23 Encounter for immunization: Secondary | ICD-10-CM

## 2023-11-11 DIAGNOSIS — I10 Essential (primary) hypertension: Secondary | ICD-10-CM | POA: Diagnosis not present

## 2023-11-11 DIAGNOSIS — M25531 Pain in right wrist: Secondary | ICD-10-CM

## 2023-11-11 DIAGNOSIS — M25561 Pain in right knee: Secondary | ICD-10-CM

## 2023-11-11 DIAGNOSIS — G35 Multiple sclerosis: Secondary | ICD-10-CM

## 2023-11-11 MED ORDER — HYDROCHLOROTHIAZIDE 12.5 MG PO CAPS
12.5000 mg | ORAL_CAPSULE | Freq: Every day | ORAL | 5 refills | Status: DC
  Filled 2023-11-11: qty 30, 30d supply, fill #0
  Filled 2023-12-12: qty 90, 90d supply, fill #1
  Filled 2024-03-02: qty 90, 90d supply, fill #2

## 2023-11-11 MED ORDER — BUSPIRONE HCL 7.5 MG PO TABS
7.5000 mg | ORAL_TABLET | Freq: Two times a day (BID) | ORAL | 5 refills | Status: DC
  Filled 2023-11-11: qty 60, 30d supply, fill #0
  Filled 2023-11-23: qty 60, 30d supply, fill #1

## 2023-11-11 NOTE — Assessment & Plan Note (Addendum)
Resolved right knee and right hand swelling. Reports persistent pain in right wrist and knee with movement and weight bearing activity. No paresthesia.  Resolved migraine with use of Amivog injection. Joint Pain is managed with tylenol 650mg .  Advised to stop mobic and robaxin Entered referral to PT Advised to use right wrist brace while waiting for appointment with PT. FMLA completed to return to work 11/18/2023.

## 2023-11-11 NOTE — Patient Instructions (Addendum)
Start hydrochlorothiazide 12.5mg  daily Maintain DASH diet Start daily exercise FMLA form will be completed. Continue to monitor BP at home

## 2023-11-11 NOTE — Assessment & Plan Note (Signed)
Home BP: 128/82, 130/90, 120/85, 130/85, 130/109, 132/100, 127/98. She has maintained a low salt diet, but no exercise. BP Readings from Last 3 Encounters:  11/11/23 120/80  11/03/23 (!) 140/80  10/25/23 105/60    Start hydrochlorothiazide 12.5mg  daily F/up in 37month

## 2023-11-11 NOTE — Assessment & Plan Note (Addendum)
Wean off effexor per neurology, plan to start cymbalta per neurology. She recently resumed buspar 7.5mg  BID Stable mood

## 2023-11-11 NOTE — Assessment & Plan Note (Signed)
Under the care of neurology-Dr. Tomi Likens, appt every 55month Current use of Ocrevus IV and vit. D
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
## 2023-11-12 NOTE — Telephone Encounter (Signed)
PAPERWORK/FORMS received  Dropped off by: MyChart Call back #: [phone] Individual made aware of 3-5 business day turn around (YES/NO): Yes GREEN charge sheet completed and patient made aware of possible charge (YES/NO): Yes Placed in provider folder at front desk. ~~~ route to CMA/provider Team  CLINICAL USE BELOW THIS LINE (use X to signify action taken)  _X__ Form received and placed in providers office for signature. ___ Form completed and faxed to LOA Dept.  ___ Form completed & LVM to notify patient ready for pick up.  ___ Charge sheet and copy of form in front office folder for office supervisor.   Paperwork Due 11/20/23.

## 2023-11-22 ENCOUNTER — Encounter: Payer: Self-pay | Admitting: Neurology

## 2023-11-22 NOTE — Addendum Note (Signed)
Addended by: Michaela Corner on: 11/22/2023 02:52 PM   Modules accepted: Orders

## 2023-11-23 ENCOUNTER — Other Ambulatory Visit: Payer: Self-pay | Admitting: Nurse Practitioner

## 2023-11-23 ENCOUNTER — Other Ambulatory Visit (HOSPITAL_COMMUNITY): Payer: Self-pay

## 2023-11-23 ENCOUNTER — Other Ambulatory Visit: Payer: Self-pay

## 2023-11-23 DIAGNOSIS — F330 Major depressive disorder, recurrent, mild: Secondary | ICD-10-CM

## 2023-11-23 DIAGNOSIS — F411 Generalized anxiety disorder: Secondary | ICD-10-CM

## 2023-11-25 ENCOUNTER — Ambulatory Visit: Payer: Managed Care, Other (non HMO)

## 2023-12-02 ENCOUNTER — Ambulatory Visit: Payer: Managed Care, Other (non HMO) | Admitting: Physical Therapy
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
## 2023-12-13 ENCOUNTER — Other Ambulatory Visit (HOSPITAL_COMMUNITY): Payer: Self-pay

## 2023-12-13 ENCOUNTER — Other Ambulatory Visit: Payer: Self-pay | Admitting: Neurology

## 2023-12-13 ENCOUNTER — Ambulatory Visit (INDEPENDENT_AMBULATORY_CARE_PROVIDER_SITE_OTHER): Payer: Managed Care, Other (non HMO) | Admitting: Nurse Practitioner

## 2023-12-13 ENCOUNTER — Encounter: Payer: Self-pay | Admitting: Nurse Practitioner

## 2023-12-13 VITALS — BP 138/78 | HR 98 | Temp 98.2°F | Ht 64.0 in | Wt 344.8 lb

## 2023-12-13 DIAGNOSIS — I10 Essential (primary) hypertension: Secondary | ICD-10-CM | POA: Diagnosis not present

## 2023-12-13 DIAGNOSIS — F339 Major depressive disorder, recurrent, unspecified: Secondary | ICD-10-CM | POA: Diagnosis not present

## 2023-12-13 DIAGNOSIS — F330 Major depressive disorder, recurrent, mild: Secondary | ICD-10-CM

## 2023-12-13 DIAGNOSIS — F411 Generalized anxiety disorder: Secondary | ICD-10-CM | POA: Diagnosis not present

## 2023-12-13 MED ORDER — DULOXETINE HCL 30 MG PO CPEP
30.0000 mg | ORAL_CAPSULE | Freq: Every day | ORAL | 5 refills | Status: DC
  Filled 2023-12-13: qty 30, 30d supply, fill #0
  Filled 2024-01-11: qty 90, 90d supply, fill #1
  Filled 2024-04-03: qty 90, 90d supply, fill #2

## 2023-12-13 MED ORDER — BUSPIRONE HCL 7.5 MG PO TABS
7.5000 mg | ORAL_TABLET | Freq: Three times a day (TID) | ORAL | 11 refills | Status: AC
  Filled 2023-12-13: qty 90, 30d supply, fill #0
  Filled 2024-01-17: qty 90, 30d supply, fill #1
  Filled 2024-03-02: qty 90, 30d supply, fill #2
  Filled 2024-04-03: qty 90, 30d supply, fill #3
  Filled 2024-05-09: qty 90, 30d supply, fill #4
  Filled 2024-06-18: qty 90, 30d supply, fill #5
  Filled 2024-09-03: qty 90, 30d supply, fill #6
  Filled 2024-10-17: qty 90, 30d supply, fill #7
  Filled 2024-11-16: qty 90, 30d supply, fill #8

## 2023-12-13 NOTE — Assessment & Plan Note (Signed)
 Stable mood Advised to start cymbalta 30mg  as prescribed Increase buspar 7.5mg  to TID Advised to schedule appointment with EAP counselor.

## 2023-12-13 NOTE — Patient Instructions (Signed)
 Increase buspar to 7.5mg  TID Start cymbalta as prescribed Schedule appointment with EAP counselor

## 2023-12-13 NOTE — Assessment & Plan Note (Signed)
 Stable BP with hydrochlorothiazide. Denies any adverse effects BP Readings from Last 3 Encounters:  12/13/23 138/78  11/11/23 120/80  11/03/23 (!) 140/80    Maintain med dose F/up in 3months

## 2023-12-13 NOTE — Progress Notes (Signed)
 Established Patient Visit  Patient: Michelle Velasquez   DOB: 1990-12-06   33 y.o. Female  MRN: [id number] Visit Date: 12/13/2023  Subjective:    Chief Complaint  Patient presents with   Follow-up    HTN f/u    HPI HTN (hypertension), benign Stable BP with hydrochlorothiazide. Denies any adverse effects BP Readings from Last 3 Encounters:  12/13/23 138/78  11/11/23 120/80  11/03/23 (!) 140/80    Maintain med dose F/up in 3months  Depression, recurrent (HCC) Stable mood Advised to start cymbalta 30mg  as prescribed Increase buspar 7.5mg  to TID Advised to schedule appointment with EAP counselor.      10/27/2023   12:58 PM 10/15/2023    8:50 AM 12/24/2022    1:35 PM  Depression screen PHQ 2/9  Decreased Interest 2 2 3   Down, Depressed, Hopeless 2 2 3   PHQ - 2 Score 4 4 6   Altered sleeping 0 3 3  Tired, decreased energy 2 3 3   Change in appetite 1 2 3   Feeling bad or failure about yourself  1 1 2   Trouble concentrating 1 0 2  Moving slowly or fidgety/restless 0 2 0  Suicidal thoughts 0 1 2  PHQ-9 Score 9 16 21   Difficult doing work/chores Very difficult Very difficult        10/27/2023    1:00 PM 12/24/2022    1:35 PM 12/17/2022    2:43 PM 07/07/2021    1:51 PM  GAD 7 : Generalized Anxiety Score  Nervous, Anxious, on Edge 1 3 1 3   Control/stop worrying 1 2 2 2   Worry too much - different things 1 1 2 2   Trouble relaxing 1 3 3 3   Restless 0 1 0 1  Easily annoyed or irritable 1 1 1 3   Afraid - awful might happen 1 2 3 3   Total GAD 7 Score 6 13 12 17   Anxiety Difficulty Somewhat difficult  Very difficult Very difficult   Reviewed medical, surgical, and social history today  Medications: Outpatient Medications Prior to Visit  Medication Sig Note   baclofen (LIORESAL) 10 MG tablet Take 1.5 tablets (15 mg total) by mouth 3 (three) times daily as needed for muscle spasms. (Patient taking differently: Take 15 mg by mouth 3 (three) times daily.)     Cholecalciferol (VITAMIN D-3) 25 MCG (1000 UT) CAPS Take 1,000 Units by mouth at bedtime.    Cholecalciferol (VITAMIN D3) 1.25 MG (50000 UT) CAPS Take 1 capsule (50,000 Units total) by mouth every 7 (seven) days. (Patient taking differently: Take 50,000 Units by mouth every Sunday.)    docusate sodium (COLACE) 100 MG capsule Take 100 mg by mouth at bedtime.    Erenumab-aooe (AIMOVIG) 140 MG/ML SOAJ Inject 140 mg (1 ml) into the skin every 30 days.    gabapentin (NEURONTIN) 300 MG capsule Take 3 capsules (900 mg total) by mouth 3 (three) times daily.    hydrochlorothiazide (MICROZIDE) 12.5 MG capsule Take 1 capsule (12.5 mg total) by mouth daily.    Iron, Ferrous Sulfate, 325 (65 Fe) MG TABS Take 325 mg by mouth daily. With food (Patient taking differently: Take 325 mg by mouth every Sunday.)    modafinil (PROVIGIL) 200 MG tablet Take 1 tablet (200 mg total) by mouth in the morning.    ocrelizumab (OCREVUS) 300 MG/10ML injection Inject 20 mLs (600 mg total) into the vein every 6 (six) months.  ondansetron (ZOFRAN) 4 MG tablet Take 1-2 tablets (4-8 mg total) by mouth every 8 (eight) hours as needed.    rizatriptan (MAXALT-MLT) 10 MG disintegrating tablet Dissolve 1 tablet (10 mg total) by mouth as needed for migraine. May repeat in 2 hours if needed.  Maximum 2 tablets in 24 hours.    [DISCONTINUED] busPIRone (BUSPAR) 7.5 MG tablet Take 1 tablet (7.5 mg total) by mouth 2 (two) times daily.    DULoxetine (CYMBALTA) 30 MG capsule Take 1 capsule (30 mg total) by mouth daily. 12/13/2023: Has not started yet    No facility-administered medications prior to visit.   Reviewed past medical and social history.   ROS per HPI above      Objective:  BP 138/78 (BP Location: Left Arm, Patient Position: Sitting, Cuff Size: Large)   Pulse 98   Temp 98.2 F (36.8 C) (Temporal)   Ht 5\' 4"  (1.626 m)   Wt (!) 344 lb 12.8 oz (156.4 kg)   SpO2 97%   BMI 59.18 kg/m      Physical Exam Vitals and nursing  note reviewed.  Cardiovascular:     Rate and Rhythm: Normal rate and regular rhythm.     Pulses: Normal pulses.     Heart sounds: Normal heart sounds.  Pulmonary:     Effort: Pulmonary effort is normal.     Breath sounds: Normal breath sounds.  Neurological:     Mental Status: She is alert and oriented to person, place, and time.  Psychiatric:        Mood and Affect: Mood is anxious.        Speech: Speech normal.        Behavior: Behavior normal.        Thought Content: Thought content normal.        Cognition and Memory: Cognition and memory normal.     No results found for any visits on 12/13/23.    Assessment & Plan:    Problem List Items Addressed This Visit     Depression, recurrent (HCC) - Primary   Stable mood Advised to start cymbalta 30mg  as prescribed Increase buspar 7.5mg  to TID Advised to schedule appointment with EAP counselor.      Relevant Medications   busPIRone (BUSPAR) 7.5 MG tablet   HTN (hypertension), benign   Stable BP with hydrochlorothiazide. Denies any adverse effects BP Readings from Last 3 Encounters:  12/13/23 138/78  11/11/23 120/80  11/03/23 (!) 140/80    Maintain med dose F/up in 3months      Other Visit Diagnoses       Mild episode of recurrent major depressive disorder (HCC)   (Chronic)     Relevant Medications   busPIRone (BUSPAR) 7.5 MG tablet     GAD (generalized anxiety disorder)       Relevant Medications   busPIRone (BUSPAR) 7.5 MG tablet      Return in about 3 months (around 03/11/2024) for CPE (fasting).     Alysia Penna, NP
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
## 2023-12-16 ENCOUNTER — Encounter: Payer: Self-pay | Admitting: Rehabilitative and Restorative Service Providers"

## 2023-12-16 ENCOUNTER — Other Ambulatory Visit: Payer: Self-pay

## 2023-12-16 ENCOUNTER — Ambulatory Visit
Payer: Managed Care, Other (non HMO) | Attending: Nurse Practitioner | Admitting: Rehabilitative and Restorative Service Providers"

## 2023-12-16 DIAGNOSIS — M6281 Muscle weakness (generalized): Secondary | ICD-10-CM | POA: Diagnosis not present

## 2023-12-16 DIAGNOSIS — M25561 Pain in right knee: Secondary | ICD-10-CM | POA: Insufficient documentation

## 2023-12-16 DIAGNOSIS — R293 Abnormal posture: Secondary | ICD-10-CM | POA: Diagnosis not present

## 2023-12-16 DIAGNOSIS — M25531 Pain in right wrist: Secondary | ICD-10-CM | POA: Diagnosis present

## 2023-12-16 DIAGNOSIS — R2689 Other abnormalities of gait and mobility: Secondary | ICD-10-CM | POA: Diagnosis not present

## 2023-12-21 ENCOUNTER — Ambulatory Visit: Payer: Managed Care, Other (non HMO) | Admitting: Rehabilitative and Restorative Service Providers"

## 2023-12-23 ENCOUNTER — Ambulatory Visit: Payer: Managed Care, Other (non HMO) | Admitting: Rehabilitative and Restorative Service Providers"

## 2023-12-28 ENCOUNTER — Ambulatory Visit
Payer: Managed Care, Other (non HMO) | Attending: Nurse Practitioner | Admitting: Rehabilitative and Restorative Service Providers"

## 2023-12-28 ENCOUNTER — Other Ambulatory Visit: Payer: Self-pay | Admitting: Neurology

## 2023-12-28 DIAGNOSIS — M6281 Muscle weakness (generalized): Secondary | ICD-10-CM | POA: Insufficient documentation

## 2023-12-28 DIAGNOSIS — M25531 Pain in right wrist: Secondary | ICD-10-CM | POA: Insufficient documentation

## 2023-12-28 DIAGNOSIS — R293 Abnormal posture: Secondary | ICD-10-CM | POA: Insufficient documentation

## 2023-12-28 DIAGNOSIS — M25561 Pain in right knee: Secondary | ICD-10-CM | POA: Insufficient documentation

## 2023-12-28 DIAGNOSIS — R2689 Other abnormalities of gait and mobility: Secondary | ICD-10-CM | POA: Insufficient documentation

## 2023-12-29 ENCOUNTER — Other Ambulatory Visit (HOSPITAL_COMMUNITY): Payer: Self-pay

## 2023-12-29 ENCOUNTER — Other Ambulatory Visit: Payer: Self-pay

## 2023-12-29 MED ORDER — AIMOVIG 140 MG/ML ~~LOC~~ SOAJ
140.0000 mg | SUBCUTANEOUS | 0 refills | Status: DC
  Filled 2023-12-29: qty 3, 90d supply, fill #0

## 2023-12-30 ENCOUNTER — Telehealth: Payer: Self-pay | Admitting: Rehabilitative and Restorative Service Providers"

## 2023-12-30 ENCOUNTER — Ambulatory Visit: Payer: Managed Care, Other (non HMO) | Admitting: Rehabilitative and Restorative Service Providers"

## 2023-12-30 NOTE — Telephone Encounter (Signed)
 Patient has cancelled two appointments and failed to show for two appointments. Left message 12/28/23 asking that patient contact us to let us know if she plans to continue therapy. She has not responded to message. All additional scheduled appointments will be cancelled due to above.  Rosmary Dionisio P. Leonor Liv PT, MPH 12/30/23 11:34 AM

## 2024-01-04 ENCOUNTER — Ambulatory Visit: Payer: Managed Care, Other (non HMO) | Admitting: Plastic Surgery

## 2024-01-04 ENCOUNTER — Encounter: Payer: Managed Care, Other (non HMO) | Admitting: Rehabilitative and Restorative Service Providers"

## 2024-01-04 ENCOUNTER — Encounter: Payer: Self-pay | Admitting: Plastic Surgery

## 2024-01-04 DIAGNOSIS — S022XXA Fracture of nasal bones, initial encounter for closed fracture: Secondary | ICD-10-CM | POA: Diagnosis not present
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
## 2024-01-06 ENCOUNTER — Encounter: Payer: Managed Care, Other (non HMO) | Admitting: Rehabilitative and Restorative Service Providers"

## 2024-01-11 ENCOUNTER — Other Ambulatory Visit (HOSPITAL_COMMUNITY): Payer: Self-pay

## 2024-01-11 ENCOUNTER — Encounter: Payer: Managed Care, Other (non HMO) | Admitting: Rehabilitative and Restorative Service Providers"

## 2024-01-13 ENCOUNTER — Encounter: Payer: Managed Care, Other (non HMO) | Admitting: Rehabilitative and Restorative Service Providers"

## 2024-01-17 ENCOUNTER — Other Ambulatory Visit: Payer: Self-pay | Admitting: Neurology

## 2024-01-18 ENCOUNTER — Other Ambulatory Visit: Payer: Self-pay

## 2024-01-20 ENCOUNTER — Encounter: Payer: Self-pay | Admitting: Neurology

## 2024-01-20 ENCOUNTER — Institutional Professional Consult (permissible substitution) (INDEPENDENT_AMBULATORY_CARE_PROVIDER_SITE_OTHER)

## 2024-01-20 ENCOUNTER — Other Ambulatory Visit (HOSPITAL_COMMUNITY): Payer: Self-pay

## 2024-01-20 MED ORDER — MODAFINIL 200 MG PO TABS
200.0000 mg | ORAL_TABLET | Freq: Every morning | ORAL | 0 refills | Status: DC
  Filled 2024-01-20: qty 28, 28d supply, fill #0
  Filled 2024-01-20: qty 30, 30d supply, fill #0
  Filled 2024-01-20: qty 2, 2d supply, fill #0

## 2024-01-27 ENCOUNTER — Telehealth: Payer: Self-pay

## 2024-01-27 NOTE — Telephone Encounter (Signed)
*  [facility]  Pharmacy Patient Advocate Encounter   Received notification from CoverMyMeds that prior authorization for Modafinil 200MG  tablets  is required/requested.   Insurance verification completed.   The patient is insured through [employer] .   Per test claim: PA required; PA submitted to above mentioned insurance via CoverMyMeds Key/confirmation #/EOC [id number] Status is pending

## 2024-02-04 NOTE — Telephone Encounter (Signed)
 CaseId:[id number];Status:Approved;Review Type:Prior Auth;Coverage Start Date:01/27/2024;Coverage End Date:01/31/2025;

## 2024-02-07 ENCOUNTER — Other Ambulatory Visit: Payer: Self-pay | Admitting: Neurology

## 2024-02-07 ENCOUNTER — Other Ambulatory Visit: Payer: Self-pay

## 2024-02-07 ENCOUNTER — Other Ambulatory Visit (HOSPITAL_COMMUNITY): Payer: Self-pay

## 2024-02-07 MED ORDER — AIMOVIG 140 MG/ML ~~LOC~~ SOAJ
140.0000 mg | SUBCUTANEOUS | 0 refills | Status: DC
  Filled 2024-02-07: qty 3, 90d supply, fill #0

## 2024-02-08 ENCOUNTER — Other Ambulatory Visit (HOSPITAL_COMMUNITY): Payer: Self-pay

## 2024-02-24 ENCOUNTER — Telehealth: Payer: Self-pay | Admitting: *Deleted

## 2024-02-24 NOTE — Telephone Encounter (Signed)
 LVM to confirm appt for 5/12 - included address and time - [facility] 02/24/24

## 2024-02-28 ENCOUNTER — Institutional Professional Consult (permissible substitution) (INDEPENDENT_AMBULATORY_CARE_PROVIDER_SITE_OTHER): Admitting: Otolaryngology

## 2024-03-02 ENCOUNTER — Other Ambulatory Visit: Payer: Self-pay | Admitting: Neurology

## 2024-03-02 ENCOUNTER — Other Ambulatory Visit (HOSPITAL_COMMUNITY): Payer: Self-pay

## 2024-03-02 ENCOUNTER — Other Ambulatory Visit: Payer: Self-pay

## 2024-03-02 ENCOUNTER — Encounter (HOSPITAL_COMMUNITY): Payer: Self-pay

## 2024-03-02 ENCOUNTER — Other Ambulatory Visit: Payer: Self-pay | Admitting: Nurse Practitioner

## 2024-03-02 DIAGNOSIS — I10 Essential (primary) hypertension: Secondary | ICD-10-CM

## 2024-03-02 MED ORDER — MODAFINIL 200 MG PO TABS: 200.0000 mg | ORAL_TABLET | Freq: Every morning | ORAL | 0 refills | Status: DC

## 2024-03-02 MED ORDER — MODAFINIL 200 MG PO TABS: 200.0000 mg | ORAL_TABLET | Freq: Every morning | ORAL | 2 refills | Status: DC

## 2024-03-02 MED ORDER — MODAFINIL 200 MG PO TABS
200.0000 mg | ORAL_TABLET | Freq: Every morning | ORAL | 2 refills | Status: DC
  Filled 2024-03-02: qty 30, 30d supply, fill #0
  Filled 2024-04-03: qty 30, 30d supply, fill #1
  Filled 2024-05-09: qty 30, 30d supply, fill #2

## 2024-03-03 ENCOUNTER — Other Ambulatory Visit (HOSPITAL_COMMUNITY): Payer: Self-pay

## 2024-03-03 ENCOUNTER — Other Ambulatory Visit: Payer: Self-pay

## 2024-03-03 MED ORDER — HYDROCHLOROTHIAZIDE 12.5 MG PO CAPS
12.5000 mg | ORAL_CAPSULE | Freq: Every day | ORAL | 5 refills | Status: DC
  Filled 2024-03-03: qty 90, 90d supply, fill #0
  Filled 2024-04-03 – 2024-06-18 (×2): qty 90, 90d supply, fill #1
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
## 2024-03-08 ENCOUNTER — Other Ambulatory Visit (HOSPITAL_COMMUNITY): Payer: Self-pay

## 2024-03-08 ENCOUNTER — Other Ambulatory Visit

## 2024-03-08 ENCOUNTER — Ambulatory Visit (INDEPENDENT_AMBULATORY_CARE_PROVIDER_SITE_OTHER): Payer: Managed Care, Other (non HMO) | Admitting: Neurology

## 2024-03-08 ENCOUNTER — Encounter: Payer: Self-pay | Admitting: Neurology

## 2024-03-08 VITALS — BP 127/83 | HR 95 | Ht 64.0 in | Wt 344.0 lb

## 2024-03-08 DIAGNOSIS — G35 Multiple sclerosis: Secondary | ICD-10-CM

## 2024-03-08 DIAGNOSIS — G43109 Migraine with aura, not intractable, without status migrainosus: Secondary | ICD-10-CM | POA: Diagnosis not present

## 2024-03-08 DIAGNOSIS — G894 Chronic pain syndrome: Secondary | ICD-10-CM | POA: Diagnosis not present

## 2024-03-08 MED ORDER — BACLOFEN 10 MG PO TABS
15.0000 mg | ORAL_TABLET | Freq: Three times a day (TID) | ORAL | 5 refills | Status: AC
  Filled 2024-03-08 – 2024-04-03 (×2): qty 135, 30d supply, fill #0
  Filled 2024-05-09: qty 135, 30d supply, fill #1
  Filled 2024-06-18: qty 135, 30d supply, fill #2
  Filled 2024-07-17: qty 135, 30d supply, fill #3
  Filled 2024-09-03: qty 135, 30d supply, fill #4
  Filled 2024-10-01: qty 135, 30d supply, fill #5

## 2024-03-08 MED ORDER — AIMOVIG 140 MG/ML ~~LOC~~ SOAJ
140.0000 mg | SUBCUTANEOUS | 5 refills | Status: AC
  Filled 2024-03-08 – 2024-04-03 (×2): qty 3, 90d supply, fill #0
  Filled 2024-07-17: qty 3, 90d supply, fill #1
  Filled 2024-10-17 – 2024-11-16 (×2): qty 3, 90d supply, fill #2

## 2024-03-08 MED ORDER — ONDANSETRON HCL 4 MG PO TABS
4.0000 mg | ORAL_TABLET | Freq: Three times a day (TID) | ORAL | 3 refills | Status: AC | PRN
  Filled 2024-03-08 – 2024-07-17 (×2): qty 60, 10d supply, fill #0

## 2024-03-08 MED ORDER — GABAPENTIN 300 MG PO CAPS
900.0000 mg | ORAL_CAPSULE | Freq: Three times a day (TID) | ORAL | 1 refills | Status: DC
  Filled 2024-03-08 – 2024-04-03 (×2): qty 810, 90d supply, fill #0
  Filled 2024-07-05: qty 810, 90d supply, fill #1

## 2024-03-08 MED ORDER — RIZATRIPTAN BENZOATE 10 MG PO TBDP
10.0000 mg | ORAL_TABLET | ORAL | 11 refills | Status: AC | PRN
  Filled 2024-03-08 – 2024-07-17 (×2): qty 10, 30d supply, fill #0
  Filled 2024-10-01: qty 10, 30d supply, fill #1

## 2024-03-08 NOTE — Patient Instructions (Addendum)
 Ocrevus  Gabapentin  900mg  three times daily Baclofen  15mg  three times daily Aimovig  every 30 days Duloxetine  30mg  daily D3 50,000 I U weekly and D3 1000 I U daily Rizatriptan  as needed.  Zofran  as needed MRI of brain ant cervical spine with and without contrast  Check CBC with diff, LFTs, vit D, B12, IgG now and in 6 months Refer to physical therapy for gait instability

## 2024-03-09 ENCOUNTER — Ambulatory Visit: Payer: Self-pay | Admitting: Neurology

## 2024-03-09 LAB — CBC WITH DIFFERENTIAL/PLATELET
Absolute Lymphocytes: 2360 {cells}/uL (ref 850–3900)
Absolute Monocytes: 317 {cells}/uL (ref 200–950)
Basophils Absolute: 28 {cells}/uL (ref 0–200)
Basophils Relative: 0.4 %
Eosinophils Absolute: 152 {cells}/uL (ref 15–500)
Eosinophils Relative: 2.2 %
HCT: 35.8 % (ref 35.0–45.0)
Hemoglobin: 11.1 g/dL — ABNORMAL LOW (ref 11.7–15.5)
MCH: 22.7 pg — ABNORMAL LOW (ref 27.0–33.0)
MCHC: 31 g/dL — ABNORMAL LOW (ref 32.0–36.0)
MCV: 73.1 fL — ABNORMAL LOW (ref 80.0–100.0)
MPV: 10.3 fL (ref 7.5–12.5)
Monocytes Relative: 4.6 %
Neutro Abs: 4043 {cells}/uL (ref 1500–7800)
Neutrophils Relative %: 58.6 %
Platelets: 354 10*3/uL (ref 140–400)
RBC: 4.9 10*6/uL (ref 3.80–5.10)
RDW: 19.4 % — ABNORMAL HIGH (ref 11.0–15.0)
Total Lymphocyte: 34.2 %
WBC: 6.9 10*3/uL (ref 3.8–10.8)

## 2024-03-09 LAB — VITAMIN B12: Vitamin B-12: 241 pg/mL (ref 200–1100)

## 2024-03-09 LAB — COMPREHENSIVE METABOLIC PANEL WITH GFR
AG Ratio: 1.2 (calc) (ref 1.0–2.5)
ALT: 16 U/L (ref 6–29)
AST: 12 U/L (ref 10–30)
Albumin: 4.1 g/dL (ref 3.6–5.1)
Alkaline phosphatase (APISO): 89 U/L (ref 31–125)
BUN: 10 mg/dL (ref 7–25)
CO2: 27 mmol/L (ref 20–32)
Calcium: 9.8 mg/dL (ref 8.6–10.2)
Chloride: 101 mmol/L (ref 98–110)
Creat: 0.72 mg/dL (ref 0.50–0.97)
Globulin: 3.3 g/dL (ref 1.9–3.7)
Glucose, Bld: 88 mg/dL (ref 65–99)
Potassium: 4 mmol/L (ref 3.5–5.3)
Sodium: 138 mmol/L (ref 135–146)
Total Bilirubin: 0.5 mg/dL (ref 0.2–1.2)
Total Protein: 7.4 g/dL (ref 6.1–8.1)
eGFR: 112 mL/min/{1.73_m2} (ref >=60)

## 2024-03-09 LAB — VITAMIN D 25 HYDROXY (VIT D DEFICIENCY, FRACTURES): Vit D, 25-Hydroxy: 86 ng/mL (ref 30–100)

## 2024-03-09 LAB — IGG: IgG (Immunoglobin G), Serum: 943 mg/dL (ref 600–1640)

## 2024-03-10 NOTE — Progress Notes (Signed)
 LMOVM, Mychart message sent as well.

## 2024-03-20 ENCOUNTER — Other Ambulatory Visit (HOSPITAL_COMMUNITY): Payer: Self-pay

## 2024-03-23 ENCOUNTER — Encounter: Payer: Managed Care, Other (non HMO) | Admitting: Nurse Practitioner

## 2024-04-04 ENCOUNTER — Other Ambulatory Visit: Payer: Self-pay | Admitting: Neurology

## 2024-04-04 ENCOUNTER — Other Ambulatory Visit (HOSPITAL_COMMUNITY): Payer: Self-pay

## 2024-04-04 ENCOUNTER — Other Ambulatory Visit: Payer: Self-pay

## 2024-04-04 MED ORDER — DULOXETINE HCL 30 MG PO CPEP
30.0000 mg | ORAL_CAPSULE | Freq: Every day | ORAL | 1 refills | Status: DC
  Filled 2024-04-04: qty 90, 90d supply, fill #0
  Filled 2024-05-09 – 2024-07-05 (×2): qty 90, 90d supply, fill #1

## 2024-04-07 ENCOUNTER — Other Ambulatory Visit (HOSPITAL_COMMUNITY): Payer: Self-pay

## 2024-04-12 ENCOUNTER — Telehealth: Payer: Self-pay | Admitting: Pharmacy Technician

## 2024-04-12 NOTE — Telephone Encounter (Addendum)
(  2nd f/u) [facility] is still processing Ocrevus  script.  Patient has appt scheduled for 04/14/24.  Patient will also need to give verbal consent for the medication to be shipped. Ref: [id number] [facility]: [phone]  Luke

## 2024-04-14 ENCOUNTER — Ambulatory Visit (INDEPENDENT_AMBULATORY_CARE_PROVIDER_SITE_OTHER): Payer: Managed Care, Other (non HMO)

## 2024-04-14 VITALS — BP 167/93 | HR 105 | Temp 97.5°F | Resp 16 | Ht 64.0 in | Wt 340.8 lb

## 2024-04-14 DIAGNOSIS — G35 Multiple sclerosis: Secondary | ICD-10-CM | POA: Diagnosis not present

## 2024-04-14 MED ORDER — SODIUM CHLORIDE 0.9 % IV SOLN
600.0000 mg | Freq: Once | INTRAVENOUS | Status: AC
  Administered 2024-04-14: 600 mg via INTRAVENOUS
  Filled 2024-04-14: qty 20

## 2024-04-14 MED ORDER — DIPHENHYDRAMINE HCL 25 MG PO CAPS
50.0000 mg | ORAL_CAPSULE | Freq: Once | ORAL | Status: AC
  Administered 2024-04-14: 50 mg via ORAL
  Filled 2024-04-14: qty 2

## 2024-04-14 MED ORDER — ACETAMINOPHEN 325 MG PO TABS
650.0000 mg | ORAL_TABLET | Freq: Once | ORAL | Status: AC
  Administered 2024-04-14: 650 mg via ORAL
  Filled 2024-04-14: qty 2

## 2024-04-14 MED ORDER — METHYLPREDNISOLONE SODIUM SUCC 125 MG IJ SOLR
125.0000 mg | Freq: Once | INTRAMUSCULAR | Status: AC
  Administered 2024-04-14: 125 mg via INTRAVENOUS
  Filled 2024-04-14: qty 2

## 2024-04-14 NOTE — Progress Notes (Signed)
 Diagnosis: Multiple Sclerosis  Provider:  Mannam, Praveen MD  Procedure: IV Infusion  IV Type: Peripheral, IV Location: L Antecubital  Ocrevus  (Ocrelizumab ), Dose: 600 mg  Infusion Start Time: 0910  Infusion Stop Time: 1305  Post Infusion IV Care: Observation period completed and Peripheral IV Discontinued  Discharge: Condition: Good, Destination: Home . AVS Provided  Performed by:  Rocky FORBES Sar, RN

## 2024-05-02 ENCOUNTER — Other Ambulatory Visit (HOSPITAL_COMMUNITY): Payer: Self-pay

## 2024-05-09 ENCOUNTER — Other Ambulatory Visit: Payer: Self-pay | Admitting: Neurology

## 2024-05-09 ENCOUNTER — Other Ambulatory Visit: Payer: Self-pay

## 2024-05-12 ENCOUNTER — Other Ambulatory Visit: Payer: Self-pay

## 2024-05-12 ENCOUNTER — Other Ambulatory Visit (HOSPITAL_COMMUNITY): Payer: Self-pay

## 2024-05-12 MED ORDER — VITAMIN D3 1.25 MG (50000 UT) PO CAPS
50000.0000 [IU] | ORAL_CAPSULE | ORAL | 3 refills | Status: AC
  Filled 2024-05-12 – 2024-06-18 (×2): qty 12, 84d supply, fill #0
  Filled 2024-09-03: qty 12, 84d supply, fill #1
  Filled 2024-10-17: qty 12, 84d supply, fill #2

## 2024-05-23 ENCOUNTER — Other Ambulatory Visit (HOSPITAL_COMMUNITY): Payer: Self-pay

## 2024-05-25 ENCOUNTER — Telehealth: Payer: Self-pay

## 2024-05-25 NOTE — Telephone Encounter (Signed)
 Left VM for patient to return call about open referral to psych

## 2024-06-18 ENCOUNTER — Other Ambulatory Visit (HOSPITAL_COMMUNITY): Payer: Self-pay

## 2024-06-19 ENCOUNTER — Other Ambulatory Visit (HOSPITAL_COMMUNITY): Payer: Self-pay

## 2024-07-05 ENCOUNTER — Other Ambulatory Visit: Payer: Self-pay | Admitting: Neurology

## 2024-07-05 ENCOUNTER — Other Ambulatory Visit: Payer: Self-pay | Admitting: Internal Medicine

## 2024-07-05 ENCOUNTER — Other Ambulatory Visit: Payer: Self-pay

## 2024-07-05 DIAGNOSIS — I10 Essential (primary) hypertension: Secondary | ICD-10-CM

## 2024-07-07 ENCOUNTER — Other Ambulatory Visit: Payer: Self-pay

## 2024-07-07 ENCOUNTER — Other Ambulatory Visit (HOSPITAL_COMMUNITY): Payer: Self-pay

## 2024-07-07 MED ORDER — MODAFINIL 200 MG PO TABS
200.0000 mg | ORAL_TABLET | Freq: Every morning | ORAL | 2 refills | Status: AC
  Filled 2024-07-07: qty 30, 30d supply, fill #0
  Filled 2024-09-03: qty 30, 30d supply, fill #1
  Filled 2024-10-17: qty 30, 30d supply, fill #2

## 2024-07-07 MED ORDER — HYDROCHLOROTHIAZIDE 12.5 MG PO CAPS
12.5000 mg | ORAL_CAPSULE | Freq: Every day | ORAL | 0 refills | Status: DC
  Filled 2024-07-07 – 2024-09-03 (×2): qty 30, 30d supply, fill #0

## 2024-07-07 NOTE — Telephone Encounter (Signed)
 Called patient and left a detailed voice message per DPR on file asking for patient to give the office a call back ato get scheduled for an office visit with Roselie Mood, NP. I also sent a MyChart message to patient.

## 2024-07-12 ENCOUNTER — Encounter: Payer: Self-pay | Admitting: Nurse Practitioner

## 2024-07-12 ENCOUNTER — Encounter: Payer: Self-pay | Admitting: Neurology

## 2024-07-14 NOTE — Telephone Encounter (Signed)
 I am sorry but I cannot do that as it is not indicated for getting that testing done.

## 2024-07-17 ENCOUNTER — Other Ambulatory Visit (HOSPITAL_COMMUNITY): Payer: Self-pay

## 2024-07-17 DIAGNOSIS — Z0279 Encounter for issue of other medical certificate: Secondary | ICD-10-CM

## 2024-07-17 NOTE — Telephone Encounter (Signed)
 CLINICAL USE BELOW THIS LINE (use X to signify taken)  ____Form received and placed in providers office for signature. _X___Form completed and awaiting fax number to fax to LOA Dept. _X___Form completed & Patient notified via MyChart ____Charge sheet & copy of form in front office folder for office supervisor.

## 2024-07-18 NOTE — Telephone Encounter (Signed)
 Called the number for HR on the FMLA forms and left a voice message asking for someone to give me a call back with a fax number to fax the forms to since there one is not listed on the forms

## 2024-08-03 ENCOUNTER — Telehealth: Payer: Self-pay | Admitting: Pharmacy Technician

## 2024-08-03 NOTE — Telephone Encounter (Signed)
 Pharmacy Patient Advocate Encounter   Received notification from Fax that prior authorization for AIMOVIG  140MG  is required/requested.   Insurance verification completed.   The patient is insured through [employer].   Per test claim: PA required; PA submitted to above mentioned insurance via [employer] Key/confirmation #/EOC [id number] P Status is pending

## 2024-08-30 ENCOUNTER — Other Ambulatory Visit (HOSPITAL_COMMUNITY): Payer: Self-pay

## 2024-08-30 NOTE — Telephone Encounter (Signed)
 Pharmacy Patient Advocate Encounter  Received notification from [facility] that Prior Authorization for AIMOVIG  140MG  has been APPROVED from 10.16.25 to 10.21.26   PA #/Case ID/Reference #: [id number]

## 2024-09-04 ENCOUNTER — Other Ambulatory Visit (HOSPITAL_COMMUNITY): Payer: Self-pay

## 2024-09-25 ENCOUNTER — Telehealth: Payer: Self-pay

## 2024-09-25 NOTE — Telephone Encounter (Signed)
 Left VM for patient to return call to office with update on referral for psych

## 2024-09-26 ENCOUNTER — Telehealth: Payer: Self-pay | Admitting: Pharmacy Technician

## 2024-09-26 ENCOUNTER — Encounter: Payer: Self-pay | Admitting: Neurology

## 2024-09-26 NOTE — Telephone Encounter (Signed)
 Auth Submission: approved Site of care: Site of care: [facility] Payer: [employer] Medication & CPT/J Code(s) submitted: Ocrevus  Antoniette) G7649 Diagnosis Code: G35.D Route of submission (phone, fax, portal): FAXED Phone #[phone] Fax #[phone] Auth type: pharmacy benefit Must fill thur [facility] pharmacy Left detailed v/m for patient to call [facility] to give consent for [facility] to order medication on her behalf, otherwise [facility] will not ship medication without patient consent on file. Next appt 10/20/24 V/M left 10/03/24 3:01p  Units/visits requested: 600MG  X 2 DOSES Reference number: [id number] CASE NUMBER [id number] Phone:Lyndsy [phone] ext: [phone] Approval from:  10/03/24 - 10/03/25
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
## 2024-09-27 ENCOUNTER — Ambulatory Visit: Admitting: Neurology

## 2024-10-01 ENCOUNTER — Other Ambulatory Visit: Payer: Self-pay | Admitting: Neurology

## 2024-10-02 ENCOUNTER — Other Ambulatory Visit: Payer: Self-pay

## 2024-10-03 ENCOUNTER — Other Ambulatory Visit (HOSPITAL_COMMUNITY): Payer: Self-pay

## 2024-10-03 ENCOUNTER — Encounter: Payer: Self-pay | Admitting: Neurology

## 2024-10-03 MED ORDER — GABAPENTIN 300 MG PO CAPS
900.0000 mg | ORAL_CAPSULE | Freq: Three times a day (TID) | ORAL | 0 refills | Status: AC
  Filled 2024-10-03: qty 270, 30d supply, fill #0

## 2024-10-04 ENCOUNTER — Other Ambulatory Visit (HOSPITAL_COMMUNITY): Payer: Self-pay

## 2024-10-17 ENCOUNTER — Other Ambulatory Visit: Payer: Self-pay | Admitting: Neurology

## 2024-10-17 ENCOUNTER — Other Ambulatory Visit: Payer: Self-pay | Admitting: Nurse Practitioner

## 2024-10-17 ENCOUNTER — Other Ambulatory Visit (HOSPITAL_COMMUNITY): Payer: Self-pay

## 2024-10-17 ENCOUNTER — Other Ambulatory Visit: Payer: Self-pay

## 2024-10-17 DIAGNOSIS — I10 Essential (primary) hypertension: Secondary | ICD-10-CM

## 2024-10-18 ENCOUNTER — Other Ambulatory Visit: Payer: Self-pay

## 2024-10-18 ENCOUNTER — Other Ambulatory Visit (HOSPITAL_COMMUNITY): Payer: Self-pay

## 2024-10-18 MED ORDER — HYDROCHLOROTHIAZIDE 12.5 MG PO CAPS
12.5000 mg | ORAL_CAPSULE | Freq: Every day | ORAL | 0 refills | Status: AC
  Filled 2024-10-18: qty 30, 30d supply, fill #0

## 2024-10-18 MED ORDER — DULOXETINE HCL 30 MG PO CPEP
30.0000 mg | ORAL_CAPSULE | Freq: Every day | ORAL | 0 refills | Status: AC
  Filled 2024-10-18: qty 90, 90d supply, fill #0

## 2024-10-20 ENCOUNTER — Ambulatory Visit

## 2024-10-20 VITALS — BP 133/82 | HR 98 | Temp 98.4°F | Resp 16 | Ht 64.0 in | Wt 349.0 lb

## 2024-10-20 DIAGNOSIS — G35D Multiple sclerosis, unspecified: Secondary | ICD-10-CM | POA: Diagnosis not present

## 2024-10-20 MED ORDER — METHYLPREDNISOLONE SODIUM SUCC 125 MG IJ SOLR
125.0000 mg | Freq: Once | INTRAMUSCULAR | Status: AC
  Administered 2024-10-20: 125 mg via INTRAVENOUS
  Filled 2024-10-20: qty 2

## 2024-10-20 MED ORDER — ACETAMINOPHEN 325 MG PO TABS
650.0000 mg | ORAL_TABLET | Freq: Once | ORAL | Status: AC
  Administered 2024-10-20: 650 mg via ORAL
  Filled 2024-10-20: qty 2

## 2024-10-20 MED ORDER — DIPHENHYDRAMINE HCL 25 MG PO CAPS
50.0000 mg | ORAL_CAPSULE | Freq: Once | ORAL | Status: AC
  Administered 2024-10-20: 50 mg via ORAL
  Filled 2024-10-20: qty 2

## 2024-10-20 MED ORDER — SODIUM CHLORIDE 0.9 % IV SOLN
600.0000 mg | Freq: Once | INTRAVENOUS | Status: AC
  Administered 2024-10-20: 600 mg via INTRAVENOUS
  Filled 2024-10-20: qty 20

## 2024-10-20 NOTE — Progress Notes (Signed)
 Diagnosis:  Multiple Sclerosis  Provider:  Lonna Coder MD  Procedure: IV Infusion  IV Type: Peripheral, IV Location: L Antecubital   Ocrevus  (Ocrelizumab ), Dose: 600 mg  Infusion Start Time: 0917  Infusion Stop Time: 1307  Post Infusion IV Care: Observation period completed and Peripheral IV Discontinued  Discharge: Condition: Good, Destination: Home . AVS Declined  Performed by:  Leita FORBES Miles, LPN
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
## 2024-10-25 ENCOUNTER — Ambulatory Visit: Admitting: Neurology

## 2024-10-30 ENCOUNTER — Other Ambulatory Visit (HOSPITAL_COMMUNITY): Payer: Self-pay
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
## 2024-11-14 ENCOUNTER — Encounter: Payer: Self-pay | Admitting: Neurology

## 2024-11-14 ENCOUNTER — Ambulatory Visit: Admitting: Neurology

## 2024-11-16 ENCOUNTER — Other Ambulatory Visit: Payer: Self-pay

## 2024-11-16 ENCOUNTER — Other Ambulatory Visit: Payer: Self-pay | Admitting: Nurse Practitioner

## 2024-11-16 ENCOUNTER — Other Ambulatory Visit (HOSPITAL_COMMUNITY): Payer: Self-pay

## 2024-11-16 ENCOUNTER — Other Ambulatory Visit: Payer: Self-pay | Admitting: Neurology

## 2024-11-16 DIAGNOSIS — I10 Essential (primary) hypertension: Secondary | ICD-10-CM

## 2024-11-17 ENCOUNTER — Other Ambulatory Visit (HOSPITAL_COMMUNITY): Payer: Self-pay

## 2024-11-17 ENCOUNTER — Encounter: Payer: Self-pay | Admitting: Neurology

## 2024-11-17 ENCOUNTER — Encounter (HOSPITAL_COMMUNITY): Payer: Self-pay

## 2024-11-30 ENCOUNTER — Ambulatory Visit: Payer: Self-pay | Admitting: Neurology

## 2025-04-19 ENCOUNTER — Ambulatory Visit
